# Patient Record
Sex: Female | Born: 1965 | Race: Black or African American | Hispanic: No | Marital: Single | Smoking: Never smoker
Health system: Southern US, Community
[De-identification: ages and names within clinical notes are randomized; demographics above are authoritative.]

## PROBLEM LIST (undated history)

## (undated) DIAGNOSIS — K219 Gastro-esophageal reflux disease without esophagitis: Secondary | ICD-10-CM

## (undated) DIAGNOSIS — F419 Anxiety disorder, unspecified: Secondary | ICD-10-CM

## (undated) DIAGNOSIS — M1990 Unspecified osteoarthritis, unspecified site: Secondary | ICD-10-CM

## (undated) DIAGNOSIS — K589 Irritable bowel syndrome without diarrhea: Secondary | ICD-10-CM

## (undated) DIAGNOSIS — J189 Pneumonia, unspecified organism: Secondary | ICD-10-CM

## (undated) HISTORY — PX: CHOLECYSTECTOMY: SHX55

## (undated) HISTORY — PX: GANGLION CYST EXCISION: SHX1691

## (undated) HISTORY — PX: BREAST BIOPSY: SHX20

---

## 2018-02-17 ENCOUNTER — Emergency Department (HOSPITAL_COMMUNITY): Payer: BC Managed Care – PPO

## 2018-02-17 ENCOUNTER — Inpatient Hospital Stay (HOSPITAL_COMMUNITY): Payer: BC Managed Care – PPO

## 2018-02-17 ENCOUNTER — Inpatient Hospital Stay (HOSPITAL_COMMUNITY)
Admission: EM | Admit: 2018-02-17 | Discharge: 2018-02-26 | DRG: 958 | Disposition: A | Discharge status: Rehabilitation Facility | Payer: BC Managed Care – PPO | Attending: General Surgery | Admitting: General Surgery

## 2018-02-17 ENCOUNTER — Encounter (HOSPITAL_COMMUNITY): Admission: EM | Disposition: A | Discharge status: Rehabilitation Facility | Payer: Self-pay | Source: Home / Self Care

## 2018-02-17 ENCOUNTER — Encounter (HOSPITAL_COMMUNITY): Payer: Self-pay | Admitting: *Deleted

## 2018-02-17 ENCOUNTER — Inpatient Hospital Stay (HOSPITAL_COMMUNITY): Payer: BC Managed Care – PPO | Admitting: Anesthesiology

## 2018-02-17 ENCOUNTER — Other Ambulatory Visit: Payer: Self-pay

## 2018-02-17 DIAGNOSIS — T380X5A Adverse effect of glucocorticoids and synthetic analogues, initial encounter: Secondary | ICD-10-CM | POA: Diagnosis not present

## 2018-02-17 DIAGNOSIS — T1490XA Injury, unspecified, initial encounter: Secondary | ICD-10-CM | POA: Diagnosis present

## 2018-02-17 DIAGNOSIS — S2231XA Fracture of one rib, right side, initial encounter for closed fracture: Secondary | ICD-10-CM | POA: Diagnosis present

## 2018-02-17 DIAGNOSIS — G5701 Lesion of sciatic nerve, right lower limb: Secondary | ICD-10-CM | POA: Diagnosis not present

## 2018-02-17 DIAGNOSIS — R740 Nonspecific elevation of levels of transaminase and lactic acid dehydrogenase [LDH]: Secondary | ICD-10-CM | POA: Diagnosis not present

## 2018-02-17 DIAGNOSIS — D62 Acute posthemorrhagic anemia: Secondary | ICD-10-CM | POA: Diagnosis present

## 2018-02-17 DIAGNOSIS — S2239XA Fracture of one rib, unspecified side, initial encounter for closed fracture: Secondary | ICD-10-CM

## 2018-02-17 DIAGNOSIS — J302 Other seasonal allergic rhinitis: Secondary | ICD-10-CM | POA: Diagnosis present

## 2018-02-17 DIAGNOSIS — Z6828 Body mass index (BMI) 28.0-28.9, adult: Secondary | ICD-10-CM

## 2018-02-17 DIAGNOSIS — S22008A Other fracture of unspecified thoracic vertebra, initial encounter for closed fracture: Secondary | ICD-10-CM

## 2018-02-17 DIAGNOSIS — E8809 Other disorders of plasma-protein metabolism, not elsewhere classified: Secondary | ICD-10-CM | POA: Diagnosis not present

## 2018-02-17 DIAGNOSIS — R509 Fever, unspecified: Secondary | ICD-10-CM | POA: Diagnosis not present

## 2018-02-17 DIAGNOSIS — S82891B Other fracture of right lower leg, initial encounter for open fracture type I or II: Secondary | ICD-10-CM

## 2018-02-17 DIAGNOSIS — S27329A Contusion of lung, unspecified, initial encounter: Secondary | ICD-10-CM

## 2018-02-17 DIAGNOSIS — E46 Unspecified protein-calorie malnutrition: Secondary | ICD-10-CM | POA: Diagnosis not present

## 2018-02-17 DIAGNOSIS — M7989 Other specified soft tissue disorders: Secondary | ICD-10-CM | POA: Diagnosis not present

## 2018-02-17 DIAGNOSIS — S301XXA Contusion of abdominal wall, initial encounter: Secondary | ICD-10-CM | POA: Diagnosis present

## 2018-02-17 DIAGNOSIS — S82002B Unspecified fracture of left patella, initial encounter for open fracture type I or II: Secondary | ICD-10-CM | POA: Diagnosis present

## 2018-02-17 DIAGNOSIS — S82401B Unspecified fracture of shaft of right fibula, initial encounter for open fracture type I or II: Secondary | ICD-10-CM | POA: Diagnosis present

## 2018-02-17 DIAGNOSIS — T07XXXA Unspecified multiple injuries, initial encounter: Secondary | ICD-10-CM

## 2018-02-17 DIAGNOSIS — S82201B Unspecified fracture of shaft of right tibia, initial encounter for open fracture type I or II: Secondary | ICD-10-CM | POA: Diagnosis present

## 2018-02-17 DIAGNOSIS — E876 Hypokalemia: Secondary | ICD-10-CM | POA: Diagnosis not present

## 2018-02-17 DIAGNOSIS — N179 Acute kidney failure, unspecified: Secondary | ICD-10-CM | POA: Diagnosis not present

## 2018-02-17 DIAGNOSIS — Z23 Encounter for immunization: Secondary | ICD-10-CM | POA: Diagnosis not present

## 2018-02-17 DIAGNOSIS — S22019A Unspecified fracture of first thoracic vertebra, initial encounter for closed fracture: Secondary | ICD-10-CM | POA: Diagnosis present

## 2018-02-17 DIAGNOSIS — S27321A Contusion of lung, unilateral, initial encounter: Secondary | ICD-10-CM | POA: Diagnosis present

## 2018-02-17 DIAGNOSIS — K5903 Drug induced constipation: Secondary | ICD-10-CM | POA: Diagnosis not present

## 2018-02-17 DIAGNOSIS — F411 Generalized anxiety disorder: Secondary | ICD-10-CM | POA: Diagnosis not present

## 2018-02-17 DIAGNOSIS — R739 Hyperglycemia, unspecified: Secondary | ICD-10-CM

## 2018-02-17 DIAGNOSIS — S92101B Unspecified fracture of right talus, initial encounter for open fracture: Secondary | ICD-10-CM | POA: Diagnosis present

## 2018-02-17 DIAGNOSIS — IMO0001 Reserved for inherently not codable concepts without codable children: Secondary | ICD-10-CM

## 2018-02-17 DIAGNOSIS — S61411A Laceration without foreign body of right hand, initial encounter: Secondary | ICD-10-CM | POA: Diagnosis present

## 2018-02-17 DIAGNOSIS — G8918 Other acute postprocedural pain: Secondary | ICD-10-CM

## 2018-02-17 DIAGNOSIS — S91001A Unspecified open wound, right ankle, initial encounter: Secondary | ICD-10-CM

## 2018-02-17 DIAGNOSIS — Y92410 Unspecified street and highway as the place of occurrence of the external cause: Secondary | ICD-10-CM | POA: Diagnosis not present

## 2018-02-17 DIAGNOSIS — F419 Anxiety disorder, unspecified: Secondary | ICD-10-CM | POA: Diagnosis present

## 2018-02-17 DIAGNOSIS — S9304XA Dislocation of right ankle joint, initial encounter: Secondary | ICD-10-CM

## 2018-02-17 DIAGNOSIS — T148XXA Other injury of unspecified body region, initial encounter: Secondary | ICD-10-CM | POA: Diagnosis not present

## 2018-02-17 DIAGNOSIS — M62838 Other muscle spasm: Secondary | ICD-10-CM | POA: Diagnosis not present

## 2018-02-17 HISTORY — PX: EXTERNAL FIXATION LEG: SHX1549

## 2018-02-17 HISTORY — DX: Unspecified osteoarthritis, unspecified site: M19.90

## 2018-02-17 HISTORY — DX: Anxiety disorder, unspecified: F41.9

## 2018-02-17 HISTORY — DX: Irritable bowel syndrome, unspecified: K58.9

## 2018-02-17 HISTORY — PX: I & D EXTREMITY: SHX5045

## 2018-02-17 LAB — CBC WITH DIFFERENTIAL/PLATELET
Basophils Absolute: 0 10*3/uL (ref 0.0–0.1)
Basophils Relative: 0 %
EOS ABS: 0.1 10*3/uL (ref 0.0–0.7)
EOS PCT: 1 %
HCT: 37.2 % (ref 36.0–46.0)
Hemoglobin: 12.9 g/dL (ref 12.0–15.0)
LYMPHS ABS: 2.9 10*3/uL (ref 0.7–4.0)
LYMPHS PCT: 20 %
MCH: 28 pg (ref 26.0–34.0)
MCHC: 34.7 g/dL (ref 30.0–36.0)
MCV: 80.9 fL (ref 78.0–100.0)
MONO ABS: 0.7 10*3/uL (ref 0.1–1.0)
MONOS PCT: 5 %
Neutro Abs: 11.1 10*3/uL — ABNORMAL HIGH (ref 1.7–7.7)
Neutrophils Relative %: 74 %
PLATELETS: 247 10*3/uL (ref 150–400)
RBC: 4.6 MIL/uL (ref 3.87–5.11)
RDW: 13.7 % (ref 11.5–15.5)
WBC: 14.8 10*3/uL — AB (ref 4.0–10.5)

## 2018-02-17 LAB — COMPREHENSIVE METABOLIC PANEL
ALT: 45 U/L (ref 14–54)
ANION GAP: 12 (ref 5–15)
AST: 54 U/L — ABNORMAL HIGH (ref 15–41)
Albumin: 3.4 g/dL — ABNORMAL LOW (ref 3.5–5.0)
Alkaline Phosphatase: 108 U/L (ref 38–126)
BUN: 11 mg/dL (ref 6–20)
CHLORIDE: 106 mmol/L (ref 101–111)
CO2: 19 mmol/L — AB (ref 22–32)
CREATININE: 1.09 mg/dL — AB (ref 0.44–1.00)
Calcium: 8.9 mg/dL (ref 8.9–10.3)
GFR, EST NON AFRICAN AMERICAN: 58 mL/min — AB (ref >60)
Glucose, Bld: 167 mg/dL — ABNORMAL HIGH (ref 65–99)
Potassium: 3.4 mmol/L — ABNORMAL LOW (ref 3.5–5.1)
SODIUM: 137 mmol/L (ref 135–145)
Total Bilirubin: 0.7 mg/dL (ref 0.3–1.2)
Total Protein: 6.6 g/dL (ref 6.5–8.1)

## 2018-02-17 LAB — TYPE AND SCREEN
ABO/RH(D): A POS
ANTIBODY SCREEN: NEGATIVE

## 2018-02-17 LAB — URINALYSIS, ROUTINE W REFLEX MICROSCOPIC
Bacteria, UA: NONE SEEN
Bilirubin Urine: NEGATIVE
GLUCOSE, UA: NEGATIVE mg/dL
Ketones, ur: NEGATIVE mg/dL
Leukocytes, UA: NEGATIVE
Nitrite: NEGATIVE
PROTEIN: NEGATIVE mg/dL
SQUAMOUS EPITHELIAL / LPF: NONE SEEN
Specific Gravity, Urine: 1.02 (ref 1.005–1.030)
pH: 6 (ref 5.0–8.0)

## 2018-02-17 LAB — ABO/RH: ABO/RH(D): A POS

## 2018-02-17 LAB — PROTIME-INR
INR: 1.17
Prothrombin Time: 14.8 seconds (ref 11.4–15.2)

## 2018-02-17 LAB — SURGICAL PCR SCREEN
MRSA, PCR: NEGATIVE
Staphylococcus aureus: NEGATIVE

## 2018-02-17 LAB — ETHANOL: Alcohol, Ethyl (B): <10 mg/dL (ref <10)

## 2018-02-17 SURGERY — EXTERNAL FIXATION, LOWER EXTREMITY
Anesthesia: General | Laterality: Right

## 2018-02-17 MED ORDER — FENTANYL CITRATE (PF) 100 MCG/2ML IJ SOLN
INTRAMUSCULAR | Status: AC | PRN
  Administered 2018-02-17: 50 ug via INTRAVENOUS

## 2018-02-17 MED ORDER — OXYCODONE HCL 5 MG PO TABS
5.0000 mg | ORAL_TABLET | Freq: Once | ORAL | Status: AC | PRN
  Administered 2018-02-17: 5 mg via ORAL

## 2018-02-17 MED ORDER — PANTOPRAZOLE SODIUM 40 MG IV SOLR
40.0000 mg | Freq: Every day | INTRAVENOUS | Status: DC
  Administered 2018-02-17: 40 mg via INTRAVENOUS
  Filled 2018-02-17: qty 40

## 2018-02-17 MED ORDER — PHENYLEPHRINE HCL 10 MG/ML IJ SOLN
INTRAMUSCULAR | Status: DC | PRN
  Administered 2018-02-17 (×3): 80 ug via INTRAVENOUS

## 2018-02-17 MED ORDER — ONDANSETRON HCL 4 MG/2ML IJ SOLN
INTRAMUSCULAR | Status: AC
  Filled 2018-02-17: qty 2

## 2018-02-17 MED ORDER — SODIUM CHLORIDE 0.9% FLUSH: 9.0000 mL | INTRAVENOUS | Status: DC | PRN

## 2018-02-17 MED ORDER — DIPHENHYDRAMINE HCL 50 MG/ML IJ SOLN: 12.5000 mg | Freq: Four times a day (QID) | INTRAMUSCULAR | Status: DC | PRN

## 2018-02-17 MED ORDER — SUGAMMADEX SODIUM 200 MG/2ML IV SOLN
INTRAVENOUS | Status: AC
  Filled 2018-02-17: qty 2

## 2018-02-17 MED ORDER — ONDANSETRON HCL 4 MG/2ML IJ SOLN: 4.0000 mg | Freq: Four times a day (QID) | INTRAMUSCULAR | Status: DC | PRN

## 2018-02-17 MED ORDER — FENTANYL CITRATE (PF) 100 MCG/2ML IJ SOLN
INTRAMUSCULAR | Status: AC
  Filled 2018-02-17: qty 2

## 2018-02-17 MED ORDER — ONDANSETRON 4 MG PO TBDP: 4.0000 mg | ORAL_TABLET | Freq: Four times a day (QID) | ORAL | Status: DC | PRN

## 2018-02-17 MED ORDER — HYDROMORPHONE HCL 1 MG/ML IJ SOLN
0.5000 mg | INTRAMUSCULAR | Status: DC | PRN
  Administered 2018-02-17: 0.5 mg via INTRAVENOUS
  Filled 2018-02-17: qty 1

## 2018-02-17 MED ORDER — ONDANSETRON HCL 4 MG/2ML IJ SOLN: 4.0000 mg | Freq: Once | INTRAMUSCULAR | Status: DC | PRN

## 2018-02-17 MED ORDER — CHLORHEXIDINE GLUCONATE 4 % EX LIQD
60.0000 mL | Freq: Once | CUTANEOUS | Status: DC
  Filled 2018-02-17: qty 60

## 2018-02-17 MED ORDER — LACTATED RINGERS IV SOLN
INTRAVENOUS | Status: DC | PRN
  Administered 2018-02-17: 19:00:00 via INTRAVENOUS

## 2018-02-17 MED ORDER — METHOCARBAMOL 1000 MG/10ML IJ SOLN
1000.0000 mg | Freq: Once | INTRAVENOUS | Status: DC
  Filled 2018-02-17: qty 10

## 2018-02-17 MED ORDER — KCL IN DEXTROSE-NACL 20-5-0.45 MEQ/L-%-% IV SOLN
INTRAVENOUS | Status: DC
  Administered 2018-02-17: 100 mL via INTRAVENOUS
  Administered 2018-02-18: 01:00:00 via INTRAVENOUS
  Filled 2018-02-17: qty 1000

## 2018-02-17 MED ORDER — TRAMADOL HCL 50 MG PO TABS: 50.0000 mg | ORAL_TABLET | Freq: Four times a day (QID) | ORAL | Status: DC | PRN

## 2018-02-17 MED ORDER — TETANUS-DIPHTH-ACELL PERTUSSIS 5-2.5-18.5 LF-MCG/0.5 IM SUSP
0.5000 mL | Freq: Once | INTRAMUSCULAR | Status: AC
Start: 2018-02-17 — End: 2018-02-17
  Administered 2018-02-17: 0.5 mL via INTRAMUSCULAR
  Filled 2018-02-17: qty 0.5

## 2018-02-17 MED ORDER — ROCURONIUM BROMIDE 100 MG/10ML IV SOLN
INTRAVENOUS | Status: DC | PRN
  Administered 2018-02-17 (×2): 20 mg via INTRAVENOUS
  Administered 2018-02-17: 10 mg via INTRAVENOUS
  Administered 2018-02-17: 30 mg via INTRAVENOUS

## 2018-02-17 MED ORDER — PANTOPRAZOLE SODIUM 40 MG PO TBEC
40.0000 mg | DELAYED_RELEASE_TABLET | Freq: Every day | ORAL | Status: DC
  Administered 2018-02-18 – 2018-02-26 (×8): 40 mg via ORAL
  Filled 2018-02-17 (×8): qty 1

## 2018-02-17 MED ORDER — LACTATED RINGERS IV SOLN
INTRAVENOUS | Status: DC
  Administered 2018-02-17: 17:00:00 via INTRAVENOUS

## 2018-02-17 MED ORDER — ONDANSETRON HCL 4 MG/2ML IJ SOLN
INTRAMUSCULAR | Status: DC | PRN
  Administered 2018-02-17: 4 mg via INTRAVENOUS

## 2018-02-17 MED ORDER — PROPOFOL 10 MG/ML IV BOLUS
INTRAVENOUS | Status: DC | PRN
  Administered 2018-02-17: 80 mg via INTRAVENOUS

## 2018-02-17 MED ORDER — FENTANYL CITRATE (PF) 100 MCG/2ML IJ SOLN
INTRAMUSCULAR | Status: AC | PRN
  Administered 2018-02-17: 100 ug via INTRAVENOUS

## 2018-02-17 MED ORDER — OXYCODONE HCL 5 MG PO TABS
ORAL_TABLET | ORAL | Status: AC
  Filled 2018-02-17: qty 1

## 2018-02-17 MED ORDER — PROPOFOL 10 MG/ML IV BOLUS
INTRAVENOUS | Status: AC
  Filled 2018-02-17: qty 20

## 2018-02-17 MED ORDER — MIDAZOLAM HCL 5 MG/5ML IJ SOLN
INTRAMUSCULAR | Status: DC | PRN
  Administered 2018-02-17: 2 mg via INTRAVENOUS

## 2018-02-17 MED ORDER — BISACODYL 10 MG RE SUPP: 10.0000 mg | Freq: Every day | RECTAL | Status: DC | PRN

## 2018-02-17 MED ORDER — 0.9 % SODIUM CHLORIDE (POUR BTL) OPTIME
TOPICAL | Status: DC | PRN
  Administered 2018-02-17: 1000 mL

## 2018-02-17 MED ORDER — SODIUM CHLORIDE 0.9 % IV SOLN
INTRAVENOUS | Status: AC | PRN
  Administered 2018-02-17: 1000 mL via INTRAVENOUS

## 2018-02-17 MED ORDER — CEFAZOLIN SODIUM-DEXTROSE 2-4 GM/100ML-% IV SOLN
2.0000 g | Freq: Once | INTRAVENOUS | Status: AC
  Administered 2018-02-17: 2 g via INTRAVENOUS
  Filled 2018-02-17: qty 100

## 2018-02-17 MED ORDER — METOPROLOL TARTRATE 5 MG/5ML IV SOLN: 5.0000 mg | Freq: Four times a day (QID) | INTRAVENOUS | Status: DC | PRN

## 2018-02-17 MED ORDER — FENTANYL CITRATE (PF) 100 MCG/2ML IJ SOLN
25.0000 ug | INTRAMUSCULAR | Status: AC | PRN
  Administered 2018-02-17 – 2018-02-18 (×6): 25 ug via INTRAVENOUS

## 2018-02-17 MED ORDER — LACTATED RINGERS IV SOLN
INTRAVENOUS | Status: DC | PRN
  Administered 2018-02-17 (×2): via INTRAVENOUS

## 2018-02-17 MED ORDER — FENTANYL CITRATE (PF) 250 MCG/5ML IJ SOLN
INTRAMUSCULAR | Status: AC
  Filled 2018-02-17: qty 5

## 2018-02-17 MED ORDER — LIDOCAINE HCL (CARDIAC) 20 MG/ML IV SOLN
INTRAVENOUS | Status: DC | PRN
  Administered 2018-02-17: 35 mg via INTRAVENOUS

## 2018-02-17 MED ORDER — SUGAMMADEX SODIUM 200 MG/2ML IV SOLN
INTRAVENOUS | Status: DC | PRN
  Administered 2018-02-17: 200 mg via INTRAVENOUS

## 2018-02-17 MED ORDER — DIPHENHYDRAMINE HCL 12.5 MG/5ML PO ELIX: 12.5000 mg | ORAL_SOLUTION | Freq: Four times a day (QID) | ORAL | Status: DC | PRN

## 2018-02-17 MED ORDER — SODIUM CHLORIDE 0.9 % IR SOLN
Status: DC | PRN
  Administered 2018-02-17: 6000 mL

## 2018-02-17 MED ORDER — HYDROMORPHONE 1 MG/ML IV SOLN
INTRAVENOUS | Status: DC
  Administered 2018-02-18: 1.2 mg via INTRAVENOUS
  Administered 2018-02-18: 3.1 mg via INTRAVENOUS
  Administered 2018-02-18: 1.2 mg via INTRAVENOUS
  Administered 2018-02-18: 01:00:00 via INTRAVENOUS
  Administered 2018-02-18: 2.1 mg via INTRAVENOUS
  Administered 2018-02-18 (×2): 3 mg via INTRAVENOUS
  Administered 2018-02-19 (×2): 0.3 mg via INTRAVENOUS
  Administered 2018-02-19: 1.2 mg via INTRAVENOUS
  Administered 2018-02-19: 1.5 mg via INTRAVENOUS
  Administered 2018-02-19: 0.9 mg via INTRAVENOUS
  Administered 2018-02-20: 1.2 mg via INTRAVENOUS
  Administered 2018-02-20: 13:00:00 via INTRAVENOUS
  Administered 2018-02-20: 2.7 mg via INTRAVENOUS
  Administered 2018-02-21: 0 mL via INTRAVENOUS
  Administered 2018-02-21: 1.5 mg via INTRAVENOUS
  Administered 2018-02-21: 0.3 mg via INTRAVENOUS
  Filled 2018-02-17: qty 25

## 2018-02-17 MED ORDER — NALOXONE HCL 0.4 MG/ML IJ SOLN: 0.4000 mg | INTRAMUSCULAR | Status: DC | PRN

## 2018-02-17 MED ORDER — FENTANYL CITRATE (PF) 100 MCG/2ML IJ SOLN: 100.0000 ug | Freq: Once | INTRAMUSCULAR | Status: DC

## 2018-02-17 MED ORDER — SUCCINYLCHOLINE CHLORIDE 20 MG/ML IJ SOLN
INTRAMUSCULAR | Status: DC | PRN
  Administered 2018-02-17 (×2): 100 mg via INTRAVENOUS

## 2018-02-17 MED ORDER — IOPAMIDOL (ISOVUE-300) INJECTION 61%
INTRAVENOUS | Status: AC
  Administered 2018-02-17: 100 mL
  Filled 2018-02-17: qty 100

## 2018-02-17 MED ORDER — OXYCODONE HCL 5 MG/5ML PO SOLN: 5.0000 mg | Freq: Once | ORAL | Status: AC | PRN

## 2018-02-17 MED ORDER — POVIDONE-IODINE 10 % EX SWAB: 2.0000 "application " | Freq: Once | CUTANEOUS | Status: DC

## 2018-02-17 MED ORDER — OXYCODONE HCL 5 MG PO TABS: 5.0000 mg | ORAL_TABLET | ORAL | Status: DC | PRN

## 2018-02-17 MED ORDER — CEFAZOLIN SODIUM-DEXTROSE 2-4 GM/100ML-% IV SOLN
2.0000 g | INTRAVENOUS | Status: AC
  Administered 2018-02-17: 2 g via INTRAVENOUS
  Filled 2018-02-17: qty 100

## 2018-02-17 MED ORDER — FENTANYL CITRATE (PF) 100 MCG/2ML IJ SOLN
50.0000 ug | INTRAMUSCULAR | Status: DC | PRN
  Administered 2018-02-17: 50 ug via INTRAVENOUS
  Filled 2018-02-17 (×2): qty 2

## 2018-02-17 MED ORDER — HYDROMORPHONE HCL 1 MG/ML IJ SOLN
1.0000 mg | INTRAMUSCULAR | Status: DC | PRN
  Administered 2018-02-17 (×3): 2 mg via INTRAVENOUS
  Filled 2018-02-17 (×3): qty 2

## 2018-02-17 SURGICAL SUPPLY — 63 items
BANDAGE ACE 4X5 VEL STRL LF (GAUZE/BANDAGES/DRESSINGS) ×8 IMPLANT
BANDAGE ACE 6X5 VEL STRL LF (GAUZE/BANDAGES/DRESSINGS) ×8 IMPLANT
BANDAGE ESMARK 6X9 LF (GAUZE/BANDAGES/DRESSINGS) ×2 IMPLANT
BAR GLASS FIBER EXFX 11X150 (EXFIX) ×8 IMPLANT
BAR GLASS FIBER EXFX 11X350 (EXFIX) ×8 IMPLANT
BNDG COHESIVE 6X5 TAN STRL LF (GAUZE/BANDAGES/DRESSINGS) IMPLANT
BNDG CONFORM 2 STRL LF (GAUZE/BANDAGES/DRESSINGS) ×4 IMPLANT
BNDG ELASTIC 2X5.8 VLCR STR LF (GAUZE/BANDAGES/DRESSINGS) ×4 IMPLANT
BNDG ESMARK 6X9 LF (GAUZE/BANDAGES/DRESSINGS) ×4
BNDG GAUZE ELAST 4 BULKY (GAUZE/BANDAGES/DRESSINGS) ×12 IMPLANT
BRUSH SCRUB SURG 4.25 DISP (MISCELLANEOUS) ×8 IMPLANT
CLAMP BAR TO BAR (EXFIX) ×8 IMPLANT
CLAMP BAR TO PIN (EXFIX) ×16 IMPLANT
CLAMP MULTI-PIN 2-BAR 75MM (EXFIX) ×4 IMPLANT
CLOSURE WOUND 1/2 X4 (GAUZE/BANDAGES/DRESSINGS)
COVER SURGICAL LIGHT HANDLE (MISCELLANEOUS) ×12 IMPLANT
CUFF TOURNIQUET SINGLE 18IN (TOURNIQUET CUFF) IMPLANT
DRAIN PENROSE 1/4X12 LTX STRL (WOUND CARE) ×4 IMPLANT
DRAPE C-ARM 42X72 X-RAY (DRAPES) IMPLANT
DRAPE C-ARMOR (DRAPES) ×4 IMPLANT
DRAPE U-SHAPE 47X51 STRL (DRAPES) ×4 IMPLANT
DRSG ADAPTIC 3X8 NADH LF (GAUZE/BANDAGES/DRESSINGS) ×4 IMPLANT
DRSG MEPITEL 4X7.2 (GAUZE/BANDAGES/DRESSINGS) ×8 IMPLANT
DRSG VAC ATS SM SENSATRAC (GAUZE/BANDAGES/DRESSINGS) ×4 IMPLANT
ELECT REM PT RETURN 9FT ADLT (ELECTROSURGICAL) ×4
ELECTRODE REM PT RTRN 9FT ADLT (ELECTROSURGICAL) ×2 IMPLANT
EVACUATOR 1/8 PVC DRAIN (DRAIN) ×4 IMPLANT
GAUZE SPONGE 4X4 12PLY STRL (GAUZE/BANDAGES/DRESSINGS) ×12 IMPLANT
GLOVE BIO SURGEON STRL SZ7.5 (GLOVE) ×4 IMPLANT
GLOVE BIO SURGEON STRL SZ8 (GLOVE) ×4 IMPLANT
GLOVE BIOGEL PI IND STRL 7.5 (GLOVE) ×2 IMPLANT
GLOVE BIOGEL PI IND STRL 8 (GLOVE) ×2 IMPLANT
GLOVE BIOGEL PI INDICATOR 7.5 (GLOVE) ×2
GLOVE BIOGEL PI INDICATOR 8 (GLOVE) ×2
GOWN STRL REUS W/ TWL LRG LVL3 (GOWN DISPOSABLE) ×4 IMPLANT
GOWN STRL REUS W/ TWL XL LVL3 (GOWN DISPOSABLE) ×2 IMPLANT
GOWN STRL REUS W/TWL LRG LVL3 (GOWN DISPOSABLE) ×4
GOWN STRL REUS W/TWL XL LVL3 (GOWN DISPOSABLE) ×2
HALF PIN 3MM (EXFIX) ×4 IMPLANT
K-WIRE DBL END TROCAR 6X.062 (WIRE) ×4
KIT BASIN OR (CUSTOM PROCEDURE TRAY) ×4 IMPLANT
KIT ROOM TURNOVER OR (KITS) ×4 IMPLANT
KWIRE DBL END TROCAR 6X.062 (WIRE) ×2 IMPLANT
MANIFOLD NEPTUNE II (INSTRUMENTS) ×4 IMPLANT
NEEDLE 22X1 1/2 (OR ONLY) (NEEDLE) IMPLANT
NS IRRIG 1000ML POUR BTL (IV SOLUTION) ×4 IMPLANT
PACK ORTHO EXTREMITY (CUSTOM PROCEDURE TRAY) ×4 IMPLANT
PAD ARMBOARD 7.5X6 YLW CONV (MISCELLANEOUS) ×8 IMPLANT
PADDING CAST COTTON 6X4 STRL (CAST SUPPLIES) ×8 IMPLANT
PIN 3MM (EXFIX) ×4 IMPLANT
PIN HALF YELLOW 5X160X35 (EXFIX) ×8 IMPLANT
PIN TRANSFIXING 5.0 (EXFIX) ×4 IMPLANT
SPONGE LAP 18X18 X RAY DECT (DISPOSABLE) ×4 IMPLANT
STAPLER VISISTAT 35W (STAPLE) IMPLANT
STOCKINETTE IMPERVIOUS LG (DRAPES) IMPLANT
STRIP CLOSURE SKIN 1/2X4 (GAUZE/BANDAGES/DRESSINGS) IMPLANT
SUT ETHILON 2 0 PSLX (SUTURE) ×4 IMPLANT
SUT PDS AB 1 CT  36 (SUTURE) ×2
SUT PDS AB 1 CT 36 (SUTURE) ×2 IMPLANT
SUT PDS AB 2-0 CT1 27 (SUTURE) ×4 IMPLANT
TOWEL OR 17X24 6PK STRL BLUE (TOWEL DISPOSABLE) ×8 IMPLANT
TOWEL OR 17X26 10 PK STRL BLUE (TOWEL DISPOSABLE) ×8 IMPLANT
UNDERPAD 30X30 (UNDERPADS AND DIAPERS) ×4 IMPLANT

## 2018-02-17 NOTE — Anesthesia Procedure Notes (Signed)
Procedure Name: Intubation Date/Time: 02/17/2018 7:19 PM Performed by: Purvis Kilts, CRNA Pre-anesthesia Checklist: Patient identified, Emergency Drugs available, Suction available, Patient being monitored and Timeout performed Patient Re-evaluated:Patient Re-evaluated prior to induction Oxygen Delivery Method: Circle system utilized Preoxygenation: Pre-oxygenation with 100% oxygen Induction Type: IV induction Ventilation: Mask ventilation without difficulty Laryngoscope Size: Mac and 3 Grade View: Grade I Tube type: Oral Tube size: 7.0 mm Number of attempts: 1 Airway Equipment and Method: Stylet Placement Confirmation: ETT inserted through vocal cords under direct vision,  positive ETCO2 and breath sounds checked- equal and bilateral Secured at: 21 cm Tube secured with: Tape Dental Injury: Teeth and Oropharynx as per pre-operative assessment

## 2018-02-17 NOTE — Transfer of Care (Signed)
Immediate Anesthesia Transfer of Care Note  [facility]  Procedure(s) Performed: EXTERNAL FIXATION RIGHT ANKLE/I&D RIGHT ANKLE (Right ) IRRIGATION AND DEBRIDEMENT PATELLA (Left ) MINOR IRRIGATION AND DEBRIDEMENT RIGHT HAND (Right )  Patient Location: PACU  Anesthesia Type:General  Level of Consciousness: awake  Airway & Oxygen Therapy: Patient Spontanous Breathing  Post-op Assessment: Report given to RN and Post -op Vital signs reviewed and stable  Post vital signs: Reviewed and stable  Last Vitals:  Vitals Value Taken Time  BP    Temp    Pulse    Resp    SpO2      Last Pain:  Vitals:   02/17/18 1636  TempSrc: Oral  PainSc:          Complications: No apparent anesthesia complications

## 2018-02-17 NOTE — ED Notes (Signed)
Spoke with ortho PA RE patients uncontrolled pain.  PA to put in orders for PCA pump and robaxin IV.

## 2018-02-17 NOTE — ED Notes (Signed)
Placed on 2L [place] d/t large dose of narcotics being given.
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
## 2018-02-17 NOTE — Progress Notes (Signed)
Received patient from ED and upon transferring patint from strecther to bed, OR calling for patient to be pick up for surgery.VS taken nad mrsa screening done. Consent was taken as well prior to transfer. Called report to short stay.

## 2018-02-17 NOTE — Brief Op Note (Signed)
02/17/2018  11:03 PM  PATIENT:  Lauren [name]  52 y.o. female  PRE-OPERATIVE DIAGNOSIS:   1. GRADE 3A OPEN RIGHT ANKLE DISLOCATION 2. GRADE 3A OPEN RIGHT FIBULA FRACTURE 3. CONTAMINATED LEFT KNEE TRAUMATIC ARTHROTOMY 4. GRADE 3A OPEN LEFT PATELLA FRACTURE 5. PENETRATING WOUND RIGHT KNEE 6. RIGHT HAND SMALL AND RING FINGER LACERATIONS  POST-OPERATIVE DIAGNOSIS:   1. GRADE 3A OPEN RIGHT ANKLE DISLOCATION 2. GRADE 3A OPEN RIGHT FIBULA FRACTURE 3. CONTAMINATED LEFT KNEE TRAUMATIC ARTHROTOMY 4. GRADE 3A OPEN LEFT PATELLA FRACTURE 5. PENETRATING WOUND RIGHT KNEE 6. RIGHT HAND SMALL AND RING FINGER LACERATIONS  PROCEDURE:  Procedure(s): 1. OPEN TREATMENT OF RIGHT TALUS FRACTURE DISLOCATION 2. EXPLORATION AND DEBRIDEMENT OF PENETRATING EXTREMITY WOUND RIGHT KNEE  3. OPEN TREATMENT OF RIGHT FIBULA FRACTURE 4. OPEN TREATMENT OF LEFT PATELLA FRACTURE 5. APPLICATION OF EXTERNAL FIXATION RIGHT ANKLE 6. ARTHROTOMY WITH IRRIGATION AND DEBRIDEMENT LEFT OPEN KNEE JOINT 7. IRRIGATION AND DEBRIDEMENT RIGHT FIBULA FRACTURE 8. IRRIGATION AND DEBRIDEMENT OPEN LEFT PATELLA FRACTURE 9. IRRIGATION AND SIMPLE CLOSURE RIGHT HAND WOUNDS SMALL FINGER AND RING FINGER 10. RETENTION SUTURE CLOSURE LEFT KNEE WOUND 15CM 11. APPLICATION OF SMALL WOUND VAC RIGHT ANKLE  SURGEON:  Surgeon(s) and Role:    Carola Frost* Lauren [name], Lauren NeedleMichael, MD - Primary  PHYSICIAN ASSISTANT: PA Student  ANESTHESIA:   general  EBL:  180 mL   BLOOD ADMINISTERED:none  DRAINS: Penrose drain in the both knees; hemovac in left knee; wound vac right ankle   LOCAL MEDICATIONS USED:  NONE  SPECIMEN:  No Specimen  DISPOSITION OF SPECIMEN:  N/A  COUNTS:  YES  TOURNIQUET:  * No tourniquets in log *  DICTATION: .Other Dictation: Dictation Number [id number]  PLAN OF CARE: Admit to inpatient   PATIENT DISPOSITION:  PACU - hemodynamically stable.   Delay start of Pharmacological VTE agent (>24hrs) due to surgical blood loss or risk of bleeding:  no
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
## 2018-02-17 NOTE — ED Notes (Signed)
Transported to ct 

## 2018-02-17 NOTE — Progress Notes (Signed)
   02/17/18 1000  Clinical Encounter Type  Visited With Patient and family together  Visit Type ED  Referral From Chaplain  Consult/Referral To Chaplain  Spiritual Encounters  Spiritual Needs Emotional;Prayer   Took over this patient from on call Chaplain.  Patient has lots of support from her family and co-workers.  Visited and supported patient and the family.  Will follow and support as needed. Chaplain Agustin CreeNewton Azavier Creson

## 2018-02-17 NOTE — ED Notes (Addendum)
Trauma start  

## 2018-02-18 ENCOUNTER — Encounter (HOSPITAL_COMMUNITY): Payer: Self-pay | Admitting: Orthopedic Surgery

## 2018-02-18 ENCOUNTER — Inpatient Hospital Stay (HOSPITAL_COMMUNITY): Payer: BC Managed Care – PPO

## 2018-02-18 LAB — COMPREHENSIVE METABOLIC PANEL
ALBUMIN: 3 g/dL — AB (ref 3.5–5.0)
ALT: 42 U/L (ref 14–54)
AST: 48 U/L — ABNORMAL HIGH (ref 15–41)
Alkaline Phosphatase: 82 U/L (ref 38–126)
Anion gap: 8 (ref 5–15)
BUN: 10 mg/dL (ref 6–20)
CO2: 25 mmol/L (ref 22–32)
Calcium: 8.1 mg/dL — ABNORMAL LOW (ref 8.9–10.3)
Chloride: 106 mmol/L (ref 101–111)
Creatinine, Ser: 1.2 mg/dL — ABNORMAL HIGH (ref 0.44–1.00)
GFR calc Af Amer: >60 mL/min (ref >60)
GFR calc non Af Amer: 52 mL/min — ABNORMAL LOW (ref >60)
GLUCOSE: 192 mg/dL — AB (ref 65–99)
POTASSIUM: 4.1 mmol/L (ref 3.5–5.1)
SODIUM: 139 mmol/L (ref 135–145)
Total Bilirubin: 0.9 mg/dL (ref 0.3–1.2)
Total Protein: 6 g/dL — ABNORMAL LOW (ref 6.5–8.1)

## 2018-02-18 LAB — CBC
HCT: 30.3 % — ABNORMAL LOW (ref 36.0–46.0)
HEMOGLOBIN: 10.1 g/dL — AB (ref 12.0–15.0)
MCH: 26.9 pg (ref 26.0–34.0)
MCHC: 33.3 g/dL (ref 30.0–36.0)
MCV: 80.6 fL (ref 78.0–100.0)
Platelets: 228 10*3/uL (ref 150–400)
RBC: 3.76 MIL/uL — ABNORMAL LOW (ref 3.87–5.11)
RDW: 13.8 % (ref 11.5–15.5)
WBC: 9.1 10*3/uL (ref 4.0–10.5)

## 2018-02-18 LAB — HIV ANTIBODY (ROUTINE TESTING W REFLEX): HIV Screen 4th Generation wRfx: NONREACTIVE

## 2018-02-18 MED ORDER — CEFAZOLIN SODIUM-DEXTROSE 1-4 GM/50ML-% IV SOLN
1.0000 g | Freq: Three times a day (TID) | INTRAVENOUS | Status: DC
  Administered 2018-02-18 – 2018-02-24 (×19): 1 g via INTRAVENOUS
  Filled 2018-02-18 (×20): qty 50

## 2018-02-18 MED ORDER — ALBUTEROL SULFATE (2.5 MG/3ML) 0.083% IN NEBU: 3.0000 mL | INHALATION_SOLUTION | RESPIRATORY_TRACT | Status: DC | PRN

## 2018-02-18 MED ORDER — ACETAMINOPHEN 325 MG PO TABS
650.0000 mg | ORAL_TABLET | Freq: Four times a day (QID) | ORAL | Status: DC | PRN
  Administered 2018-02-19: 650 mg via ORAL
  Filled 2018-02-18: qty 2

## 2018-02-18 MED ORDER — HYDROMORPHONE 1 MG/ML IV SOLN
INTRAVENOUS | Status: AC
  Filled 2018-02-18: qty 25

## 2018-02-18 MED ORDER — BUSPIRONE HCL 10 MG PO TABS
10.0000 mg | ORAL_TABLET | Freq: Three times a day (TID) | ORAL | Status: DC
  Administered 2018-02-18 – 2018-02-26 (×25): 10 mg via ORAL
  Filled 2018-02-18 (×29): qty 1

## 2018-02-18 MED ORDER — HYDROMORPHONE HCL 1 MG/ML IJ SOLN
INTRAMUSCULAR | Status: AC
  Filled 2018-02-18: qty 1

## 2018-02-18 MED ORDER — FLUTICASONE PROPIONATE 50 MCG/ACT NA SUSP
1.0000 | Freq: Every day | NASAL | Status: DC
  Administered 2018-02-18: 2 via NASAL
  Administered 2018-02-19 – 2018-02-25 (×5): 1 via NASAL
  Filled 2018-02-18: qty 16

## 2018-02-18 MED ORDER — KCL IN DEXTROSE-NACL 20-5-0.45 MEQ/L-%-% IV SOLN
INTRAVENOUS | Status: AC
  Filled 2018-02-18: qty 1000

## 2018-02-18 MED ORDER — SODIUM CHLORIDE 0.9 % IV SOLN: Freq: Three times a day (TID) | INTRAVENOUS | Status: DC

## 2018-02-18 MED ORDER — SODIUM CHLORIDE 0.9 % IV SOLN: INTRAVENOUS | Status: DC

## 2018-02-18 MED ORDER — MONTELUKAST SODIUM 10 MG PO TABS
10.0000 mg | ORAL_TABLET | Freq: Every day | ORAL | Status: DC
  Administered 2018-02-19 – 2018-02-25 (×7): 10 mg via ORAL
  Filled 2018-02-18 (×7): qty 1

## 2018-02-18 MED ORDER — SODIUM CHLORIDE 0.9 % IV SOLN
INTRAVENOUS | Status: DC
  Administered 2018-02-18 – 2018-02-21 (×3): via INTRAVENOUS
  Filled 2018-02-18 (×2): qty 1000

## 2018-02-18 MED ORDER — ENOXAPARIN SODIUM 40 MG/0.4ML ~~LOC~~ SOLN
40.0000 mg | SUBCUTANEOUS | Status: DC
  Administered 2018-02-18 – 2018-02-26 (×8): 40 mg via SUBCUTANEOUS
  Filled 2018-02-18 (×8): qty 0.4

## 2018-02-18 MED ORDER — HYDROMORPHONE HCL 1 MG/ML IJ SOLN
0.2500 mg | INTRAMUSCULAR | Status: DC | PRN
  Administered 2018-02-18: 0.5 mg via INTRAVENOUS

## 2018-02-18 MED ORDER — GENTAMICIN IN SALINE 1.6-0.9 MG/ML-% IV SOLN
80.0000 mg | Freq: Three times a day (TID) | INTRAVENOUS | Status: DC
  Administered 2018-02-18 – 2018-02-20 (×8): 80 mg via INTRAVENOUS
  Filled 2018-02-18 (×9): qty 50
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
## 2018-02-18 NOTE — Anesthesia Postprocedure Evaluation (Signed)
Anesthesia Post Note  Patient: Lora PaulaMaxine Truxillo  Procedure(s) Performed: EXTERNAL FIXATION RIGHT ANKLE/I&D RIGHT ANKLE (Right ) IRRIGATION AND DEBRIDEMENT PATELLA (Left ) MINOR IRRIGATION AND DEBRIDEMENT RIGHT HAND (Right )     Patient location during evaluation: PACU Anesthesia Type: General Level of consciousness: sedated, oriented and patient cooperative Pain management: pain level controlled (paim improving) Vital Signs Assessment: post-procedure vital signs reviewed and stable Respiratory status: spontaneous breathing, nonlabored ventilation, respiratory function stable and patient connected to nasal cannula oxygen Cardiovascular status: blood pressure returned to baseline and stable Postop Assessment: no apparent nausea or vomiting Anesthetic complications: no    Last Vitals:  Vitals:   02/18/18 0045 02/18/18 0058  BP:    Pulse: 84   Resp: 17 13  Temp:    SpO2: 100% 99%    Last Pain:  Vitals:   02/18/18 0058  TempSrc:   PainSc: 10-Worst pain ever                 Ausencio Vaden,E. Quanesha Klimaszewski

## 2018-02-18 NOTE — Plan of Care (Signed)
  Problem: Health Behavior/Discharge Planning: Goal: Ability to manage health-related needs will improve Outcome: Progressing   Problem: Clinical Measurements: Goal: Ability to maintain clinical measurements within normal limits will improve Outcome: Progressing Goal: Diagnostic test results will improve Outcome: Progressing Goal: Respiratory complications will improve Outcome: Progressing Goal: Cardiovascular complication will be avoided Outcome: Progressing   Problem: Safety: Goal: Ability to remain free from injury will improve Outcome: Progressing
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
## 2018-02-18 NOTE — Progress Notes (Signed)
[facility] (OTS)  1 Day Post-Op Procedure(s) (LRB): EXTERNAL FIXATION RIGHT ANKLE/I&D RIGHT ANKLE (Right) IRRIGATION AND DEBRIDEMENT PATELLA (Left) MINOR IRRIGATION AND DEBRIDEMENT RIGHT HAND (Right)  Subjective: Patient reports pain as moderate. Decreased sensation in dorsum of right foot.  Objective: Current Vitals Blood pressure 109/78, pulse 85, temperature 99.8 F (37.7 C), temperature source Oral, resp. rate 14, height 5\' 8"  (1.727 m), weight 85.7 kg (189 lb), SpO2 100 %. Vital signs in last 24 hours: Temp:  [97.5 F (36.4 C)-99.8 F (37.7 C)] 99.8 F (37.7 C) (03/26 1018) Pulse Rate:  [67-102] 85 (03/26 1018) Resp:  [9-21] 14 (03/26 1158) BP: (88-147)/(53-102) 109/78 (03/26 1018) SpO2:  [95 %-100 %] 100 % (03/26 1158)  Intake/Output from previous day: 03/25 0701 - 03/26 0700 In: 4115 [P.O.:720; I.V.:3145; IV Piggyback:100] Out: 3575 [Urine:3425; Drains:50; Blood:100]  LABS Recent Labs    02/17/18 0815 02/18/18 0500  HGB 12.9 10.1*   Recent Labs    02/17/18 0815 02/18/18 0500  WBC 14.8* 9.1  RBC 4.60 3.76*  HCT 37.2 30.3*  PLT 247 228   Recent Labs    02/17/18 0815 02/18/18 0500  NA 137 139  K 3.4* 4.1  CL 106 106  CO2 19* 25  BUN 11 10  CREATININE 1.09* 1.20*  GLUCOSE 167* 192*  CALCIUM 8.9 8.1*   Recent Labs    02/17/18 0815  INR 1.17     Physical Exam RUE  Dressing clean and intact  Sens  Ax/R/M/U intact  Mot   Ax/ R/ PIN/ M/ AIN/ U intact  Brisk CR R&LLE  Dressing intact, clean, dry  Edema/ swelling controlled  Sens: DPN, SPN, TN intact on Left; absent DPN and reduced SPN on the right  Motor: EHL, FHL, and lessor toe ext and flex all intact grossly  Brisk cap refill, warm to touch  Assessment/Plan: 1 Day Post-Op Procedure(s) (LRB): EXTERNAL FIXATION RIGHT ANKLE/I&D RIGHT ANKLE (Right) IRRIGATION AND DEBRIDEMENT PATELLA (Left) MINOR IRRIGATION AND DEBRIDEMENT RIGHT HAND (Right) 1. PT/OT  2. DVT proph  Lovenox 3. OR on Thursday am for repeat I&D  Myrene GalasMichael Pihu Basil, MD [facility] [phone] (p)

## 2018-02-19 LAB — CBC
HCT: 26.5 % — ABNORMAL LOW (ref 36.0–46.0)
Hemoglobin: 8.7 g/dL — ABNORMAL LOW (ref 12.0–15.0)
MCH: 26.8 pg (ref 26.0–34.0)
MCHC: 32.8 g/dL (ref 30.0–36.0)
MCV: 81.5 fL (ref 78.0–100.0)
PLATELETS: 178 10*3/uL (ref 150–400)
RBC: 3.25 MIL/uL — ABNORMAL LOW (ref 3.87–5.11)
RDW: 13.6 % (ref 11.5–15.5)
WBC: 9.1 10*3/uL (ref 4.0–10.5)

## 2018-02-19 LAB — BASIC METABOLIC PANEL
Anion gap: 9 (ref 5–15)
BUN: 7 mg/dL (ref 6–20)
CALCIUM: 8.3 mg/dL — AB (ref 8.9–10.3)
CHLORIDE: 105 mmol/L (ref 101–111)
CO2: 23 mmol/L (ref 22–32)
CREATININE: 0.94 mg/dL (ref 0.44–1.00)
GFR calc Af Amer: >60 mL/min (ref >60)
Glucose, Bld: 104 mg/dL — ABNORMAL HIGH (ref 65–99)
Potassium: 4.3 mmol/L (ref 3.5–5.1)
SODIUM: 137 mmol/L (ref 135–145)

## 2018-02-19 MED ORDER — METHOCARBAMOL 500 MG PO TABS
500.0000 mg | ORAL_TABLET | Freq: Three times a day (TID) | ORAL | Status: DC
  Administered 2018-02-19 – 2018-02-26 (×22): 500 mg via ORAL
  Filled 2018-02-19 (×22): qty 1

## 2018-02-19 NOTE — Anesthesia Preprocedure Evaluation (Addendum)
Anesthesia Evaluation  Patient identified by MRN, date of birth, ID band Patient awake    Reviewed: Allergy & Precautions, H&P , NPO status , Patient's Chart, lab work & pertinent test results  Airway Mallampati: II  TM Distance: >3 FB Neck ROM: Full    Dental no notable dental hx. (+) Teeth Intact, Dental Advisory Given   Pulmonary neg pulmonary ROS,    Pulmonary exam normal breath sounds clear to auscultation       Cardiovascular Exercise Tolerance: Good negative cardio ROS   Rhythm:Regular Rate:Normal     Neuro/Psych Anxiety negative neurological ROS  negative psych ROS   GI/Hepatic negative GI ROS, Neg liver ROS,   Endo/Other  negative endocrine ROS  Renal/GU negative Renal ROS  negative genitourinary   Musculoskeletal  (+) Arthritis , Osteoarthritis,    Abdominal   Peds  Hematology negative hematology ROS (+)   Anesthesia Other Findings   Reproductive/Obstetrics negative OB ROS                            Anesthesia Physical Anesthesia Plan  ASA: II  Anesthesia Plan: General   Post-op Pain Management:    Induction: Intravenous  PONV Risk Score and Plan: 4 or greater and Ondansetron, Dexamethasone and Midazolam  Airway Management Planned: Oral ETT  Additional Equipment:   Intra-op Plan:   Post-operative Plan: Extubation in OR  Informed Consent: I have reviewed the patients History and Physical, chart, labs and discussed the procedure including the risks, benefits and alternatives for the proposed anesthesia with the patient or authorized representative who has indicated his/her understanding and acceptance.   Dental advisory given  Plan Discussed with: CRNA  Anesthesia Plan Comments:         Anesthesia Quick Evaluation

## 2018-02-19 NOTE — Progress Notes (Signed)
[facility]uma [facility] (OTS)  2 Days Post-Op Procedure(s) (LRB): EXTERNAL FIXATION RIGHT ANKLE/I&D RIGHT ANKLE (Right) IRRIGATION AND DEBRIDEMENT PATELLA (Left) MINOR IRRIGATION AND DEBRIDEMENT RIGHT HAND (Right)  Subjective: Patient reports pain as improving; no change in loss of sensation on the right foot..    Objective: Current Vitals Blood pressure 118/65, pulse 83, temperature 99.9 F (37.7 C), temperature source Oral, resp. rate 16, height 5\' 8"  (1.727 m), weight 85.7 kg (189 lb), SpO2 100 %. Vital signs in last 24 hours: Temp:  [98.7 F (37.1 C)-100.9 F (38.3 C)] 99.9 F (37.7 C) (03/27 1751) Pulse Rate:  [74-92] 83 (03/27 1751) Resp:  [14-17] 16 (03/27 1951) BP: (108-128)/(8-65) 118/65 (03/27 1751) SpO2:  [100 %] 100 % (03/27 1951) FiO2 (%):  [50 %] 50 % (03/26 2350)  Intake/Output from previous day: 03/26 0701 - 03/27 0700 In: 1716.7 [P.O.:960; I.V.:656.7; IV Piggyback:100] Out: 4075 [Urine:4000; Drains:75]  LABS Recent Labs    02/17/18 0815 02/18/18 0500 02/19/18 0854  HGB 12.9 10.1* 8.7*   Recent Labs    02/18/18 0500 02/19/18 0854  WBC 9.1 9.1  RBC 3.76* 3.25*  HCT 30.3* 26.5*  PLT 228 178   Recent Labs    02/18/18 0500 02/19/18 0854  NA 139 137  K 4.1 4.3  CL 106 105  CO2 25 23  BUN 10 7  CREATININE 1.20* 0.94  GLUCOSE 192* 104*  CALCIUM 8.1* 8.3*   Recent Labs    02/17/18 0815  INR 1.17     Physical Exam R&LLE  Dressing intact, clean, dry  Edema/ swelling controlled  Sens: DPN, SPN, TN intact  Motor: EHL, FHL, and lessor toe ext and flex all intact grossly  Brisk cap refill, warm to touch  Assessment/Plan: 2 Days Post-Op Procedure(s) (LRB): EXTERNAL FIXATION RIGHT ANKLE/I&D RIGHT ANKLE (Right) IRRIGATION AND DEBRIDEMENT PATELLA (Left) MINOR IRRIGATION AND DEBRIDEMENT RIGHT HAND (Right) 1. PT/OT WBAT on LLE for xfers 2. DVT proph Lovenox 3. OR tomorrow for repeat I&D; cont abx  Lauren GalasMichael Izreal Kock, MD [facility] [phone] (p)
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
## 2018-02-20 ENCOUNTER — Inpatient Hospital Stay (HOSPITAL_COMMUNITY): Payer: BC Managed Care – PPO | Admitting: Anesthesiology

## 2018-02-20 ENCOUNTER — Inpatient Hospital Stay (HOSPITAL_COMMUNITY): Payer: BC Managed Care – PPO

## 2018-02-20 ENCOUNTER — Encounter (HOSPITAL_COMMUNITY): Admission: EM | Disposition: A | Discharge status: Rehabilitation Facility | Payer: Self-pay | Source: Home / Self Care

## 2018-02-20 HISTORY — PX: I & D EXTREMITY: SHX5045

## 2018-02-20 LAB — BASIC METABOLIC PANEL
Anion gap: 9 (ref 5–15)
BUN: 6 mg/dL (ref 6–20)
CALCIUM: 8.1 mg/dL — AB (ref 8.9–10.3)
CHLORIDE: 103 mmol/L (ref 101–111)
CO2: 25 mmol/L (ref 22–32)
CREATININE: 0.87 mg/dL (ref 0.44–1.00)
GFR calc Af Amer: >60 mL/min (ref >60)
GFR calc non Af Amer: >60 mL/min (ref >60)
Glucose, Bld: 124 mg/dL — ABNORMAL HIGH (ref 65–99)
Potassium: 3.9 mmol/L (ref 3.5–5.1)
Sodium: 137 mmol/L (ref 135–145)

## 2018-02-20 LAB — CBC
HCT: 26.1 % — ABNORMAL LOW (ref 36.0–46.0)
Hemoglobin: 8.6 g/dL — ABNORMAL LOW (ref 12.0–15.0)
MCH: 26.6 pg (ref 26.0–34.0)
MCHC: 33 g/dL (ref 30.0–36.0)
MCV: 80.8 fL (ref 78.0–100.0)
Platelets: 179 10*3/uL (ref 150–400)
RBC: 3.23 MIL/uL — AB (ref 3.87–5.11)
RDW: 13.6 % (ref 11.5–15.5)
WBC: 9.2 10*3/uL (ref 4.0–10.5)

## 2018-02-20 SURGERY — IRRIGATION AND DEBRIDEMENT EXTREMITY
Anesthesia: General | Site: Leg Lower | Laterality: Bilateral

## 2018-02-20 MED ORDER — CEFAZOLIN SODIUM 1 G IJ SOLR
INTRAMUSCULAR | Status: AC
  Filled 2018-02-20: qty 10

## 2018-02-20 MED ORDER — LIDOCAINE HCL (CARDIAC) 20 MG/ML IV SOLN
INTRAVENOUS | Status: AC
Start: 2018-02-20 — End: ?
  Filled 2018-02-20: qty 5

## 2018-02-20 MED ORDER — ROCURONIUM BROMIDE 10 MG/ML (PF) SYRINGE
PREFILLED_SYRINGE | INTRAVENOUS | Status: AC
  Filled 2018-02-20: qty 5

## 2018-02-20 MED ORDER — ONDANSETRON HCL 4 MG/2ML IJ SOLN
INTRAMUSCULAR | Status: AC
  Filled 2018-02-20: qty 2

## 2018-02-20 MED ORDER — SODIUM CHLORIDE 0.9 % IR SOLN
Status: DC | PRN
  Administered 2018-02-20 (×4): 3000 mL

## 2018-02-20 MED ORDER — FENTANYL CITRATE (PF) 250 MCG/5ML IJ SOLN
INTRAMUSCULAR | Status: AC
  Filled 2018-02-20: qty 5

## 2018-02-20 MED ORDER — LORATADINE 10 MG PO TABS
10.0000 mg | ORAL_TABLET | Freq: Every day | ORAL | Status: DC
  Administered 2018-02-20 – 2018-02-26 (×7): 10 mg via ORAL
  Filled 2018-02-20 (×7): qty 1

## 2018-02-20 MED ORDER — MIDAZOLAM HCL 5 MG/5ML IJ SOLN
INTRAMUSCULAR | Status: DC | PRN
  Administered 2018-02-20: 2 mg via INTRAVENOUS

## 2018-02-20 MED ORDER — SUGAMMADEX SODIUM 200 MG/2ML IV SOLN
INTRAVENOUS | Status: AC
  Filled 2018-02-20: qty 2

## 2018-02-20 MED ORDER — ROCURONIUM BROMIDE 10 MG/ML (PF) SYRINGE
PREFILLED_SYRINGE | INTRAVENOUS | Status: DC | PRN
  Administered 2018-02-20: 10 mg via INTRAVENOUS
  Administered 2018-02-20: 40 mg via INTRAVENOUS
  Administered 2018-02-20: 10 mg via INTRAVENOUS
  Administered 2018-02-20: 20 mg via INTRAVENOUS
  Administered 2018-02-20: 10 mg via INTRAVENOUS

## 2018-02-20 MED ORDER — KETOROLAC TROMETHAMINE 30 MG/ML IJ SOLN
INTRAMUSCULAR | Status: DC | PRN
  Administered 2018-02-20: 30 mg via INTRAVENOUS

## 2018-02-20 MED ORDER — ONDANSETRON HCL 4 MG/2ML IJ SOLN
INTRAMUSCULAR | Status: DC | PRN
  Administered 2018-02-20: 4 mg via INTRAVENOUS

## 2018-02-20 MED ORDER — HYDROMORPHONE HCL 1 MG/ML IJ SOLN: 0.2500 mg | INTRAMUSCULAR | Status: DC | PRN

## 2018-02-20 MED ORDER — DEXAMETHASONE SODIUM PHOSPHATE 10 MG/ML IJ SOLN
INTRAMUSCULAR | Status: DC | PRN
  Administered 2018-02-20: 10 mg via INTRAVENOUS

## 2018-02-20 MED ORDER — VANCOMYCIN HCL 1000 MG IV SOLR
INTRAVENOUS | Status: DC | PRN
  Administered 2018-02-20: 1000 mg via TOPICAL

## 2018-02-20 MED ORDER — MIDAZOLAM HCL 2 MG/2ML IJ SOLN
INTRAMUSCULAR | Status: AC
  Filled 2018-02-20: qty 2

## 2018-02-20 MED ORDER — LIDOCAINE 2% (20 MG/ML) 5 ML SYRINGE
INTRAMUSCULAR | Status: DC | PRN
  Administered 2018-02-20: 60 mg via INTRAVENOUS

## 2018-02-20 MED ORDER — PROPOFOL 10 MG/ML IV BOLUS
INTRAVENOUS | Status: AC
  Filled 2018-02-20: qty 20

## 2018-02-20 MED ORDER — KETOROLAC TROMETHAMINE 30 MG/ML IJ SOLN
INTRAMUSCULAR | Status: AC
  Filled 2018-02-20: qty 1

## 2018-02-20 MED ORDER — VANCOMYCIN HCL 1000 MG IV SOLR
INTRAVENOUS | Status: AC
  Filled 2018-02-20: qty 2000

## 2018-02-20 MED ORDER — LACTATED RINGERS IV SOLN
INTRAVENOUS | Status: DC | PRN
  Administered 2018-02-20 (×2): via INTRAVENOUS

## 2018-02-20 MED ORDER — DEXAMETHASONE SODIUM PHOSPHATE 10 MG/ML IJ SOLN
INTRAMUSCULAR | Status: AC
  Filled 2018-02-20: qty 1

## 2018-02-20 MED ORDER — FENTANYL CITRATE (PF) 250 MCG/5ML IJ SOLN
INTRAMUSCULAR | Status: DC | PRN
  Administered 2018-02-20: 25 ug via INTRAVENOUS
  Administered 2018-02-20: 50 ug via INTRAVENOUS
  Administered 2018-02-20: 75 ug via INTRAVENOUS

## 2018-02-20 MED ORDER — 0.9 % SODIUM CHLORIDE (POUR BTL) OPTIME
TOPICAL | Status: DC | PRN
  Administered 2018-02-20: 1000 mL

## 2018-02-20 MED ORDER — PROPOFOL 10 MG/ML IV BOLUS
INTRAVENOUS | Status: DC | PRN
  Administered 2018-02-20: 150 mg via INTRAVENOUS

## 2018-02-20 MED ORDER — SUGAMMADEX SODIUM 200 MG/2ML IV SOLN
INTRAVENOUS | Status: DC | PRN
  Administered 2018-02-20: 171.4 mg via INTRAVENOUS

## 2018-02-20 SURGICAL SUPPLY — 64 items
BANDAGE ACE 4X5 VEL STRL LF (GAUZE/BANDAGES/DRESSINGS) ×6 IMPLANT
BANDAGE ACE 6X5 VEL STRL LF (GAUZE/BANDAGES/DRESSINGS) ×6 IMPLANT
BNDG COHESIVE 4X5 TAN STRL (GAUZE/BANDAGES/DRESSINGS) IMPLANT
BNDG ELASTIC 2 VLCR STRL LF (GAUZE/BANDAGES/DRESSINGS) ×3 IMPLANT
BNDG GAUZE ELAST 4 BULKY (GAUZE/BANDAGES/DRESSINGS) ×12 IMPLANT
BNDG GAUZE STRTCH 6 (GAUZE/BANDAGES/DRESSINGS) IMPLANT
BRUSH SCRUB SURG 4.25 DISP (MISCELLANEOUS) ×6 IMPLANT
COVER SURGICAL LIGHT HANDLE (MISCELLANEOUS) ×3 IMPLANT
DRAIN PENROSE 1/4X12 LTX STRL (WOUND CARE) ×3 IMPLANT
DRAPE EXTREMITY BILATERAL (DRAPES) ×3 IMPLANT
DRAPE U-SHAPE 47X51 STRL (DRAPES) IMPLANT
DRSG ADAPTIC 3X8 NADH LF (GAUZE/BANDAGES/DRESSINGS) IMPLANT
DRSG MEPITEL 4X7.2 (GAUZE/BANDAGES/DRESSINGS) ×6 IMPLANT
DRSG VAC ATS SM SENSATRAC (GAUZE/BANDAGES/DRESSINGS) ×3 IMPLANT
ELECT REM PT RETURN 9FT ADLT (ELECTROSURGICAL) ×3
ELECTRODE REM PT RTRN 9FT ADLT (ELECTROSURGICAL) ×1 IMPLANT
EVACUATOR 3/16  PVC DRAIN (DRAIN) ×2
EVACUATOR 3/16 PVC DRAIN (DRAIN) ×1 IMPLANT
FLUID NSS /IRRIG 3000 ML XXX (IV SOLUTION) ×12 IMPLANT
GAUZE SPONGE 4X4 12PLY STRL (GAUZE/BANDAGES/DRESSINGS) ×6 IMPLANT
GLOVE BIO SURGEON STRL SZ7.5 (GLOVE) IMPLANT
GLOVE BIO SURGEON STRL SZ8 (GLOVE) ×12 IMPLANT
GLOVE BIOGEL PI IND STRL 6.5 (GLOVE) ×1 IMPLANT
GLOVE BIOGEL PI IND STRL 7.5 (GLOVE) IMPLANT
GLOVE BIOGEL PI IND STRL 8 (GLOVE) ×2 IMPLANT
GLOVE BIOGEL PI INDICATOR 6.5 (GLOVE) ×2
GLOVE BIOGEL PI INDICATOR 7.5 (GLOVE)
GLOVE BIOGEL PI INDICATOR 8 (GLOVE) ×4
GLOVE SURG SS PI 8.0 STRL IVOR (GLOVE) ×6 IMPLANT
GOWN STRL REUS W/ TWL LRG LVL3 (GOWN DISPOSABLE) ×1 IMPLANT
GOWN STRL REUS W/ TWL XL LVL3 (GOWN DISPOSABLE) ×2 IMPLANT
GOWN STRL REUS W/TWL LRG LVL3 (GOWN DISPOSABLE) ×2
GOWN STRL REUS W/TWL XL LVL3 (GOWN DISPOSABLE) ×4
HANDPIECE INTERPULSE COAX TIP (DISPOSABLE) ×2
KIT BASIN OR (CUSTOM PROCEDURE TRAY) ×3 IMPLANT
KIT TURNOVER KIT B (KITS) ×3 IMPLANT
MANIFOLD NEPTUNE II (INSTRUMENTS) ×3 IMPLANT
NS IRRIG 1000ML POUR BTL (IV SOLUTION) ×3 IMPLANT
PACK ORTHO EXTREMITY (CUSTOM PROCEDURE TRAY) ×3 IMPLANT
PAD ABD 8X10 STRL (GAUZE/BANDAGES/DRESSINGS) ×9 IMPLANT
PAD ARMBOARD 7.5X6 YLW CONV (MISCELLANEOUS) ×3 IMPLANT
PAD CAST 4YDX4 CTTN HI CHSV (CAST SUPPLIES) ×1 IMPLANT
PADDING CAST COTTON 4X4 STRL (CAST SUPPLIES) ×2
PADDING CAST COTTON 6X4 STRL (CAST SUPPLIES) ×3 IMPLANT
RETRIEVER SUT HEWSON (MISCELLANEOUS) ×3 IMPLANT
SCRUB FOAM CHG 2% SURGICAL (MISCELLANEOUS) ×3 IMPLANT
SET HNDPC FAN SPRY TIP SCT (DISPOSABLE) ×1 IMPLANT
SPONGE LAP 18X18 X RAY DECT (DISPOSABLE) ×3 IMPLANT
STOCKINETTE IMPERVIOUS 9X36 MD (GAUZE/BANDAGES/DRESSINGS) IMPLANT
STOCKINETTE IMPERVIOUS LG (DRAPES) ×3 IMPLANT
SUT ETHILON 2 0 PSLX (SUTURE) ×6 IMPLANT
SUT PDS AB 0 CT 36 (SUTURE) ×3 IMPLANT
SUT PDS AB 1 CT  36 (SUTURE) ×4
SUT PDS AB 1 CT 36 (SUTURE) ×2 IMPLANT
SUT PDS AB 2-0 CT1 27 (SUTURE) ×6 IMPLANT
SUT PROLENE 0 CT 1 30 (SUTURE) ×3 IMPLANT
SWAB CULTURE ESWAB REG 1ML (MISCELLANEOUS) IMPLANT
TOWEL OR 17X24 6PK STRL BLUE (TOWEL DISPOSABLE) ×3 IMPLANT
TOWEL OR 17X26 10 PK STRL BLUE (TOWEL DISPOSABLE) ×3 IMPLANT
TUBE CONNECTING 12'X1/4 (SUCTIONS) ×1
TUBE CONNECTING 12X1/4 (SUCTIONS) ×2 IMPLANT
UNDERPAD 30X30 (UNDERPADS AND DIAPERS) ×3 IMPLANT
WATER STERILE IRR 1000ML POUR (IV SOLUTION) IMPLANT
YANKAUER SUCT BULB TIP NO VENT (SUCTIONS) IMPLANT

## 2018-02-20 NOTE — Progress Notes (Signed)
1300 received pt from PACU. A & O x 4, bilateral dressing lower extremities, dry/intact. Pt in pain, restarted PCA.

## 2018-02-20 NOTE — Anesthesia Postprocedure Evaluation (Signed)
Anesthesia Post Note  Patient: Lauren [name]  Procedure(s) Performed: IRRIGATION AND DEBRIDEMENT TALUS, ADJUSTMENT OF EXTERNAL FIXATOR,  IRRIGATION AND DEBRIDEMENT OF LEFT KNEE, REPAIR OF LEFT PATELLA (Bilateral Leg Lower)     Patient location during evaluation: PACU Anesthesia Type: General Level of consciousness: awake and alert Pain management: pain level controlled Vital Signs Assessment: post-procedure vital signs reviewed and stable Respiratory status: spontaneous breathing, nonlabored ventilation and respiratory function stable Cardiovascular status: blood pressure returned to baseline and stable Postop Assessment: no apparent nausea or vomiting Anesthetic complications: no    Last Vitals:  Vitals:   02/20/18 1245 02/20/18 1305  BP: 140/79 133/73  Pulse: 79 80  Resp: (!) 21 18  Temp: 37.1 C 37 C  SpO2: 100% 100%    Last Pain:  Vitals:   02/20/18 1305  TempSrc: Oral  PainSc:                  Aliscia Clayton,W. EDMOND
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
## 2018-02-20 NOTE — Progress Notes (Signed)
Pt to OR. With O2 at 1L. Per OR nurse to disconnect Pt from PCA.
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
## 2018-02-20 NOTE — Transfer of Care (Signed)
Immediate Anesthesia Transfer of Care Note  Patient: Lauren [name]  Procedure(s) Performed: IRRIGATION AND DEBRIDEMENT TALUS, ADJUSTMENT OF EXTERNAL FIXATOR,  IRRIGATION AND DEBRIDEMENT OF LEFT KNEE, REPAIR OF LEFT PATELLA (Bilateral Leg Lower)  Patient Location: PACU  Anesthesia Type:General  Level of Consciousness: drowsy and patient cooperative  Airway & Oxygen Therapy: Patient Spontanous Breathing and Patient connected to face mask oxygen  Post-op Assessment: Report given to RN and Post -op Vital signs reviewed and stable  Post vital signs: Reviewed and stable  Last Vitals:  Vitals Value Taken Time  BP 176/82 02/20/2018 11:13 AM  Temp    Pulse 41 02/20/2018 11:16 AM  Resp 19 02/20/2018 11:16 AM  SpO2 100 % 02/20/2018 11:16 AM  Vitals shown include unvalidated device data.  Last Pain:  Vitals:   02/20/18 0518  TempSrc: Oral  PainSc:          Complications: No apparent anesthesia complications

## 2018-02-20 NOTE — Anesthesia Procedure Notes (Addendum)
Procedure Name: Intubation Date/Time: 02/20/2018 8:10 AM Performed by: Freddie Breech, CRNA Pre-anesthesia Checklist: Patient identified, Emergency Drugs available, Suction available and Patient being monitored Patient Re-evaluated:Patient Re-evaluated prior to induction Oxygen Delivery Method: Circle System Utilized Preoxygenation: Pre-oxygenation with 100% oxygen Induction Type: IV induction Ventilation: Mask ventilation without difficulty Laryngoscope Size: Mac and 3 Grade View: Grade II Tube type: Oral Tube size: 7.0 mm Number of attempts: 2 (CRNA DVL X1 and unable to achieve adequate view.  Repositioned head and CRNA DVL X 2 with grade 2 view. ) Airway Equipment and Method: Stylet and Oral airway Placement Confirmation: ETT inserted through vocal cords under direct vision,  positive ETCO2 and breath sounds checked- equal and bilateral Secured at: 21 cm Tube secured with: Tape Dental Injury: Teeth and Oropharynx as per pre-operative assessment

## 2018-02-21 ENCOUNTER — Encounter (HOSPITAL_COMMUNITY): Payer: Self-pay | Admitting: Orthopedic Surgery

## 2018-02-21 LAB — CBC
HEMATOCRIT: 25.2 % — AB (ref 36.0–46.0)
Hemoglobin: 8.6 g/dL — ABNORMAL LOW (ref 12.0–15.0)
MCH: 27.3 pg (ref 26.0–34.0)
MCHC: 34.1 g/dL (ref 30.0–36.0)
MCV: 80 fL (ref 78.0–100.0)
Platelets: 240 10*3/uL (ref 150–400)
RBC: 3.15 MIL/uL — AB (ref 3.87–5.11)
RDW: 13.7 % (ref 11.5–15.5)
WBC: 11 10*3/uL — AB (ref 4.0–10.5)

## 2018-02-21 LAB — BASIC METABOLIC PANEL
ANION GAP: 9 (ref 5–15)
BUN: 10 mg/dL (ref 6–20)
CALCIUM: 8.1 mg/dL — AB (ref 8.9–10.3)
CO2: 26 mmol/L (ref 22–32)
Chloride: 102 mmol/L (ref 101–111)
Creatinine, Ser: 0.86 mg/dL (ref 0.44–1.00)
Glucose, Bld: 148 mg/dL — ABNORMAL HIGH (ref 65–99)
Potassium: 3.5 mmol/L (ref 3.5–5.1)
Sodium: 137 mmol/L (ref 135–145)

## 2018-02-21 MED ORDER — DIPHENHYDRAMINE HCL 50 MG/ML IJ SOLN
12.5000 mg | Freq: Four times a day (QID) | INTRAMUSCULAR | Status: DC | PRN
  Administered 2018-02-21 – 2018-02-24 (×7): 25 mg via INTRAVENOUS
  Filled 2018-02-21 (×7): qty 1

## 2018-02-21 MED ORDER — OXYCODONE HCL 5 MG PO TABS
5.0000 mg | ORAL_TABLET | ORAL | Status: DC | PRN
  Administered 2018-02-21 – 2018-02-24 (×8): 10 mg via ORAL
  Administered 2018-02-24: 5 mg via ORAL
  Administered 2018-02-24: 10 mg via ORAL
  Administered 2018-02-24: 5 mg via ORAL
  Administered 2018-02-25: 10 mg via ORAL
  Filled 2018-02-21 (×14): qty 2

## 2018-02-21 MED ORDER — HYDROMORPHONE HCL 1 MG/ML IJ SOLN
1.0000 mg | INTRAMUSCULAR | Status: DC | PRN
  Administered 2018-02-21 – 2018-02-22 (×2): 1 mg via INTRAVENOUS
  Administered 2018-02-22: 2 mg via INTRAVENOUS
  Administered 2018-02-22 (×2): 1 mg via INTRAVENOUS
  Administered 2018-02-22: 2 mg via INTRAVENOUS
  Administered 2018-02-23 – 2018-02-24 (×5): 1 mg via INTRAVENOUS
  Filled 2018-02-21 (×3): qty 1
  Filled 2018-02-21: qty 2
  Filled 2018-02-21 (×4): qty 1
  Filled 2018-02-21 (×2): qty 2
  Filled 2018-02-21 (×2): qty 1
  Filled 2018-02-21: qty 2

## 2018-02-21 MED ORDER — ACETAMINOPHEN 325 MG PO TABS
650.0000 mg | ORAL_TABLET | Freq: Four times a day (QID) | ORAL | Status: DC
  Administered 2018-02-21 – 2018-02-26 (×22): 650 mg via ORAL
  Filled 2018-02-21 (×23): qty 2

## 2018-02-21 MED ORDER — POLYETHYLENE GLYCOL 3350 17 G PO PACK
17.0000 g | PACK | Freq: Every day | ORAL | Status: DC | PRN
  Administered 2018-02-22: 17 g via ORAL
  Filled 2018-02-21: qty 1

## 2018-02-21 MED ORDER — DOCUSATE SODIUM 100 MG PO CAPS
100.0000 mg | ORAL_CAPSULE | Freq: Two times a day (BID) | ORAL | Status: DC
  Administered 2018-02-21 – 2018-02-26 (×11): 100 mg via ORAL
  Filled 2018-02-21 (×11): qty 1
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
## 2018-02-21 NOTE — Progress Notes (Signed)
Orthopaedic Trauma Service (OTS)  1 Day Post-Op Procedure(s) (LRB): IRRIGATION AND DEBRIDEMENT TALUS, ADJUSTMENT OF EXTERNAL FIXATOR,  IRRIGATION AND DEBRIDEMENT OF LEFT KNEE, REPAIR OF LEFT PATELLA (Bilateral)  Subjective: Patient reports pain as moderately severe at the moment on more on the left knee than the right ankle. Some difficulty mobilizing.  Objective: Current Vitals Blood pressure 123/68, pulse 73, temperature 98.5 F (36.9 C), temperature source Oral, resp. rate 14, height 5\' 8"  (1.727 m), weight 85.7 kg (189 lb), SpO2 100 %. Vital signs in last 24 hours: Temp:  [97.7 F (36.5 C)-98.8 F (37.1 C)] 98.5 F (36.9 C) (03/29 1059) Pulse Rate:  [72-90] 73 (03/29 1059) Resp:  [10-27] 14 (03/29 1114) BP: (105-151)/(59-87) 123/68 (03/29 1059) SpO2:  [97 %-100 %] 100 % (03/29 1114) FiO2 (%):  [50 %] 50 % (03/29 1114)  Intake/Output from previous day: 03/28 0701 - 03/29 0700 In: 3439.2 [P.O.:590; I.V.:2799.2; IV Piggyback:50] Out: 4060 [Urine:3950; Drains:10; Blood:100]  LABS Recent Labs    02/19/18 0854 02/20/18 0412 02/21/18 0849  HGB 8.7* 8.6* 8.6*   Recent Labs    02/20/18 0412 02/21/18 0849  WBC 9.2 11.0*  RBC 3.23* 3.15*  HCT 26.1* 25.2*  PLT 179 240   Recent Labs    02/20/18 0412 02/21/18 0849  NA 137 137  K 3.9 3.5  CL 103 102  CO2 25 26  BUN 6 10  CREATININE 0.87 0.86  GLUCOSE 124* 148*  CALCIUM 8.1* 8.1*   No results for input(s): LABPT, INR in the last 72 hours.   Physical Exam RUE  Dressing changed R&LLE   Drains intact  Dressing intact, clean, dry  Edema/ swelling controlled  Sens: DPN, SPN, TN intact  Motor: EHL, FHL, and lessor toe ext and flex all intact grossly  Brisk cap refill, warm to touch  Assessment/Plan: 1 Day Post-Op Procedure(s) (LRB): IRRIGATION AND DEBRIDEMENT TALUS, ADJUSTMENT OF EXTERNAL FIXATOR,  IRRIGATION AND DEBRIDEMENT OF LEFT KNEE, REPAIR OF LEFT PATELLA (Bilateral) 1. PT/OT WBAT on LLE; no active  extension against resistance to extent feasible; no motion restrictions! 2. DVT proph Lovenox 3. No further surgery planned this admission; late removal of ex fix at 6-8 wks 4. Drains to be removed from left knee tomorrow, hemovacs and two penrose 5. Rehab as inpatient likely which would be great in my opinion  Myrene GalasMichael Adelaido Nicklaus, MD [facility] [phone] (p)
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
## 2018-02-22 LAB — COMPREHENSIVE METABOLIC PANEL
ALBUMIN: 2.4 g/dL — AB (ref 3.5–5.0)
ALT: 53 U/L (ref 14–54)
AST: 89 U/L — AB (ref 15–41)
Alkaline Phosphatase: 77 U/L (ref 38–126)
Anion gap: 9 (ref 5–15)
BUN: 10 mg/dL (ref 6–20)
CHLORIDE: 103 mmol/L (ref 101–111)
CO2: 27 mmol/L (ref 22–32)
Calcium: 8.3 mg/dL — ABNORMAL LOW (ref 8.9–10.3)
Creatinine, Ser: 0.95 mg/dL (ref 0.44–1.00)
GFR calc Af Amer: >60 mL/min (ref >60)
GFR calc non Af Amer: >60 mL/min (ref >60)
GLUCOSE: 120 mg/dL — AB (ref 65–99)
POTASSIUM: 3.3 mmol/L — AB (ref 3.5–5.1)
Sodium: 139 mmol/L (ref 135–145)
Total Bilirubin: 0.6 mg/dL (ref 0.3–1.2)
Total Protein: 5.9 g/dL — ABNORMAL LOW (ref 6.5–8.1)

## 2018-02-22 LAB — CBC
HCT: 24.5 % — ABNORMAL LOW (ref 36.0–46.0)
Hemoglobin: 8.3 g/dL — ABNORMAL LOW (ref 12.0–15.0)
MCH: 27.7 pg (ref 26.0–34.0)
MCHC: 33.9 g/dL (ref 30.0–36.0)
MCV: 81.7 fL (ref 78.0–100.0)
PLATELETS: 250 10*3/uL (ref 150–400)
RBC: 3 MIL/uL — ABNORMAL LOW (ref 3.87–5.11)
RDW: 13.9 % (ref 11.5–15.5)
WBC: 6.8 10*3/uL (ref 4.0–10.5)
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
## 2018-02-22 NOTE — Progress Notes (Signed)
Subjective: 2 Days Post-Op Procedure(s) (LRB): IRRIGATION AND DEBRIDEMENT TALUS, ADJUSTMENT OF EXTERNAL FIXATOR,  IRRIGATION AND DEBRIDEMENT OF LEFT KNEE, REPAIR OF LEFT PATELLA (Bilateral) Patient reports pain as moderate.    Objective: Vital signs in last 24 hours: Temp:  [98.5 F (36.9 C)-98.7 F (37.1 C)] 98.7 F (37.1 C) (03/30 0351) Pulse Rate:  [72-82] 73 (03/30 0351) Resp:  [14-18] 18 (03/30 0351) BP: (109-123)/(63-70) 109/63 (03/30 0351) SpO2:  [99 %-100 %] 99 % (03/30 0351) FiO2 (%):  [50 %] 50 % (03/29 1114)  Intake/Output from previous day: 03/29 0701 - 03/30 0700 In: 520 [P.O.:470; IV Piggyback:50] Out: 1580 [Urine:1550; Drains:30] Intake/Output this shift: No intake/output data recorded.  Recent Labs    02/20/18 0412 02/21/18 0849 02/22/18 0732  HGB 8.6* 8.6* 8.3*   Recent Labs    02/21/18 0849 02/22/18 0732  WBC 11.0* 6.8  RBC 3.15* 3.00*  HCT 25.2* 24.5*  PLT 240 250   Recent Labs    02/21/18 0849 02/22/18 0732  NA 137 139  K 3.5 3.3*  CL 102 103  CO2 26 27  BUN 10 10  CREATININE 0.86 0.95  GLUCOSE 148* 120*  CALCIUM 8.1* 8.3*   No results for input(s): LABPT, INR in the last 72 hours.  Neurovascular intact Sensation intact distally Intact pulses distally Dorsiflexion/Plantar flexion intact Incision: dressing C/D/I, scant drainage and 2 hemovacs and 2 penrose drains pulled from left knee and dsg changed today No cellulitis present Compartment soft  Assessment/Plan: 2 Days Post-Op Procedure(s) (LRB): IRRIGATION AND DEBRIDEMENT TALUS, ADJUSTMENT OF EXTERNAL FIXATOR,  IRRIGATION AND DEBRIDEMENT OF LEFT KNEE, REPAIR OF LEFT PATELLA (Bilateral)  1. PT/OT WBAT on LLE; no active extension against resistance to extent feasible; no motion restrictions! 2. DVT proph Lovenox 3. No further surgery planned this admission; late removal of ex fix at 6-8 wks 4. Drains to be removed from left knee tomorrow, hemovacs and two penrose 5. Rehab as  inpatient likely which would be great in my opinion  Trapeze for bed ordered   [facility] Physicians[facility] Stephanie Mcglone 02/22/2018, 10:41 AM

## 2018-02-22 NOTE — Progress Notes (Signed)
Orthopedic Tech Progress Note Patient Details:  Lora PaulaMaxine Whisner 03/12/1966 [id number]  Ortho Devices Ortho Device/Splint Location: trapeze bar patient helper Ortho Device/Splint Interventions: Application   Post Interventions Patient Tolerated: Well Instructions Provided: Care of device   Nikki DomCrawford, Samad Thon 02/22/2018, 11:49 AM

## 2018-02-23 NOTE — Progress Notes (Signed)
Subjective: 3 Days Post-Op Procedure(s) (LRB): IRRIGATION AND DEBRIDEMENT TALUS, ADJUSTMENT OF EXTERNAL FIXATOR,  IRRIGATION AND DEBRIDEMENT OF LEFT KNEE, REPAIR OF LEFT PATELLA (Bilateral) Patient reports pain as mild and moderate.    Objective: Vital signs in last 24 hours: Temp:  [97.6 F (36.4 C)-98.6 F (37 C)] 98.6 F (37 C) (03/31 0500) Pulse Rate:  [68-78] 68 (03/31 0500) Resp:  [17-18] 18 (03/31 0500) BP: (110-142)/(59-83) 110/59 (03/31 0500) SpO2:  [100 %] 100 % (03/31 0500)  Intake/Output from previous day: 03/30 0701 - 03/31 0700 In: 1000 [P.O.:1000] Out: 2600 [Urine:2600] Intake/Output this shift: Total I/O In: 236 [P.O.:236] Out: -   Recent Labs    02/21/18 0849 02/22/18 0732  HGB 8.6* 8.3*   Recent Labs    02/21/18 0849 02/22/18 0732  WBC 11.0* 6.8  RBC 3.15* 3.00*  HCT 25.2* 24.5*  PLT 240 250   Recent Labs    02/21/18 0849 02/22/18 0732  NA 137 139  K 3.5 3.3*  CL 102 103  CO2 26 27  BUN 10 10  CREATININE 0.86 0.95  GLUCOSE 148* 120*  CALCIUM 8.1* 8.3*   No results for input(s): LABPT, INR in the last 72 hours.  Neurovascular intact Sensation intact distally Incision: dressing C/D/I  Assessment/Plan: 3 Days Post-Op Procedure(s) (LRB): IRRIGATION AND DEBRIDEMENT TALUS, ADJUSTMENT OF EXTERNAL FIXATOR,  IRRIGATION AND DEBRIDEMENT OF LEFT KNEE, REPAIR OF LEFT PATELLA (Bilateral)  1. PT/OTWBAT on LLE; no active extension against resistance to extent feasible; no motion restrictions! 2. DVT prophLovenox 3.No further surgery planned this admission; late removal of ex fix at 6-8 wks 4. Rehab as inpatient likely which would be great in my opinion    Margart SicklesJoshua Onnie Hatchel 02/23/2018, 9:57 AM
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
## 2018-02-24 DIAGNOSIS — F411 Generalized anxiety disorder: Secondary | ICD-10-CM

## 2018-02-24 DIAGNOSIS — E876 Hypokalemia: Secondary | ICD-10-CM

## 2018-02-24 DIAGNOSIS — R739 Hyperglycemia, unspecified: Secondary | ICD-10-CM

## 2018-02-24 DIAGNOSIS — T148XXA Other injury of unspecified body region, initial encounter: Secondary | ICD-10-CM

## 2018-02-24 DIAGNOSIS — T380X5A Adverse effect of glucocorticoids and synthetic analogues, initial encounter: Secondary | ICD-10-CM

## 2018-02-24 DIAGNOSIS — G8918 Other acute postprocedural pain: Secondary | ICD-10-CM

## 2018-02-24 DIAGNOSIS — T1490XA Injury, unspecified, initial encounter: Secondary | ICD-10-CM

## 2018-02-24 DIAGNOSIS — T07XXXA Unspecified multiple injuries, initial encounter: Secondary | ICD-10-CM

## 2018-02-24 DIAGNOSIS — D62 Acute posthemorrhagic anemia: Secondary | ICD-10-CM

## 2018-02-24 MED ORDER — HYDROMORPHONE HCL 1 MG/ML IJ SOLN
1.0000 mg | INTRAMUSCULAR | Status: DC | PRN
  Administered 2018-02-24: 1 mg via INTRAVENOUS
  Filled 2018-02-24: qty 2
  Filled 2018-02-24: qty 1

## 2018-02-24 MED ORDER — TRAMADOL HCL 50 MG PO TABS
50.0000 mg | ORAL_TABLET | Freq: Four times a day (QID) | ORAL | Status: DC
  Administered 2018-02-24 – 2018-02-26 (×9): 50 mg via ORAL
  Filled 2018-02-24 (×9): qty 1

## 2018-02-24 MED ORDER — POLYETHYLENE GLYCOL 3350 17 G PO PACK
17.0000 g | PACK | Freq: Two times a day (BID) | ORAL | Status: DC
  Administered 2018-02-24 – 2018-02-26 (×4): 17 g via ORAL
  Filled 2018-02-24 (×4): qty 1

## 2018-02-24 MED ORDER — DIPHENHYDRAMINE HCL 12.5 MG/5ML PO ELIX: 12.5000 mg | ORAL_SOLUTION | Freq: Four times a day (QID) | ORAL | Status: DC | PRN
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
## 2018-02-24 NOTE — Progress Notes (Addendum)
POD # 4 s/p ORIF R talus frx POD #3 s/p repair of left patella   Subjective: Ms. Archie PattenMonroe reports she is comfortable this morning. She is concerned that she has not had a BM in one week. She is also concerned about skin break down to her bottom. PT visited her Friday 02/21/18 but did not know if she was allowed to bend her knee. She reports her bottom feels hot and that she placed ice packs to cool the area. Her pain is 5/10 in the RLE, and 0/10 in the LLE. She denies SOB or chest pain. Foley in place with good urine output. VSS. Tolerating solid food.    Objective:   Vitals:   02/23/18 2141 02/24/18 0456  BP: (!) 110/43 110/62  Pulse: 76 66  Resp: 17 16  Temp: 98.2 F (36.8 C) 98.6 F (37 C)  SpO2: 100% 100%    Intake/Output Summary (Last 24 hours) at 02/24/2018 0743 Last data filed at 02/23/2018 2045 Gross per 24 hour  Intake 476 ml  Output 2350 ml  Net -1874 ml    PE: Gen: NAD, resting comfortably in bed  Card: RRR Pulm: CTABL Abd: soft, hyperactive Bowel sounds.  Ext: RUE: dressing in place over sitched lacerations. No boney deformity, minimal swelling, CDI  RLE wound vac in place, Exfix in place, dressing CDI,  LLE Dressing in place, CDI Skin:  Skin to bottom had pink foam bandages over pressure points Psych: answers questions appropriately,   Lab Results:      Studies/Results:  ImagingResults(Last48hours)  Dg Ankle Complete Right  Result Date: 02/20/2018 CLINICAL DATA:  Adjustment of external fixator EXAM: DG C-ARM 61-120 MIN; RIGHT ANKLE - COMPLETE 3+ VIEW COMPARISON:  02/18/2018 FLUOROSCOPY TIME:  Fluoroscopy Time:  13 seconds Radiation Exposure Index (if provided by the fluoroscopic device): Not available Number of Acquired Spot Images: 4 FINDINGS: Fixation wires again noted traversing the distal fibular fracture. External fixator devices are seen. IMPRESSION: Intraoperative fluoroscopy for fixator adjustment Electronically Signed   By: Alcide CleverMark  Lukens  M.D.   On: 02/20/2018 11:13   Dg Ankle Right Port  Result Date: 02/20/2018 CLINICAL DATA:  External fixator adjustment and irrigation and debridement of the open talus fracture dislocation. EXAM: PORTABLE RIGHT ANKLE - 2 VIEW COMPARISON:  Intraoperative x-rays from same day. CT right ankle dated February 18, 2018. FINDINGS: Unchanged K-wire traversing distal fibular fracture. External fixation device remains in place. Alignment is near anatomic. The talar dome is intact. The ankle mortise is symmetric. Bone mineralization is normal. Wound VAC in place. IMPRESSION: 1. External fixation device remains in place with near anatomic alignment of the ankle. Electronically Signed   By: Obie DredgeWilliam T Derry M.D.   On: 02/20/2018 13:05   Dg C-arm 1-60 Min  Result Date: 02/20/2018 CLINICAL DATA:  Adjustment of external fixator EXAM: DG C-ARM 61-120 MIN; RIGHT ANKLE - COMPLETE 3+ VIEW COMPARISON:  02/18/2018 FLUOROSCOPY TIME:  Fluoroscopy Time:  13 seconds Radiation Exposure Index (if provided by the fluoroscopic device): Not available Number of Acquired Spot Images: 4 FINDINGS: Fixation wires again noted traversing the distal fibular fracture. External fixator devices are seen. IMPRESSION: Intraoperative fluoroscopy for fixator adjustment Electronically Signed   By: Alcide CleverMark  Lukens M.D.   On: 02/20/2018 11:13     Anti-infectives: Antibiotics Given (last 72 hours)    Date/Time Action Medication Dose Rate   02/21/18 1227 New Bag/Given   ceFAZolin (ANCEF) IVPB 1 g/50 mL premix 1 g 100 mL/hr   02/21/18 1757 New  Bag/Given   ceFAZolin (ANCEF) IVPB 1 g/50 mL premix 1 g 100 mL/hr   02/22/18 0354 New Bag/Given   ceFAZolin (ANCEF) IVPB 1 g/50 mL premix 1 g 100 mL/hr   02/22/18 1059 New Bag/Given   ceFAZolin (ANCEF) IVPB 1 g/50 mL premix 1 g 100 mL/hr   02/22/18 1839 New Bag/Given   ceFAZolin (ANCEF) IVPB 1 g/50 mL premix 1 g 100 mL/hr   02/23/18 0146 New Bag/Given   ceFAZolin (ANCEF) IVPB 1 g/50 mL premix 1 g 100  mL/hr   02/23/18 1045 New Bag/Given   ceFAZolin (ANCEF) IVPB 1 g/50 mL premix 1 g 100 mL/hr   02/23/18 1838 New Bag/Given   ceFAZolin (ANCEF) IVPB 1 g/50 mL premix 1 g 100 mL/hr   02/24/18 0133 New Bag/Given   ceFAZolin (ANCEF) IVPB 1 g/50 mL premix 1 g 100 mL/hr       Assessment/Plan  Open L knee fracture - s/p repair of left patella .- PT/OTWBAT on LLE; no active extension against resistance to extent feasible; no motion restrictions! Open R ankle fracture -S/P ORIF R talus frx, wash out of R knee and ankle, ex fix to R ankle, wash out of L knee - s/p repeat debridement right ankle and L knee, retention suture closure of L knee and R ankle - No further surgery planned this admission; late removal of ex fix at 6-8 wks R lateral 10th rib fx with pulm contusion  - pain control, IS, pulm toilet R hand laceration and pain - remove bandage to last two fingers - ask ortho when to remove stitches  - films negative for fracture, S/P washout and wound closure, Handy, 03/25 Pressure sores  - PT consult Abdominal wall contusion - resolved T1 spinous process fracture - PT/OT, pain control Fevers: -  now afebrile - continue to monitor AKI - resolved, Cr. Has returned to baseline,    FEN:reg diet  VTE: SCD's, lovenox ZO:XWRUE 03/25>> Foley:yes Follow AV:[id number]  DISPO:PO pain medication with IV push for breakthrough pain.    Willeen Cass - Medical Student

## 2018-02-24 NOTE — Clinical Social Work Note (Signed)
Clinical Social Worker continuing to follow patient and family for support and discharge planning needs.  Patient assessed by inpatient rehab today and awaiting follow up from admissions coordinator.  Patient continues to verbalize the ability of siblings to provide round the clock support and assistance upon discharge.  CSW remains available for support as needed.  Macario GoldsJesse Skyleen Bentley, [place] [phone]
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
## 2018-02-24 NOTE — Progress Notes (Signed)
Patient lost IV access. PA made aware, no scheduled IV medications at this time. Will continue to monitor.

## 2018-02-25 LAB — CBC
HEMATOCRIT: 26.3 % — AB (ref 36.0–46.0)
Hemoglobin: 8.5 g/dL — ABNORMAL LOW (ref 12.0–15.0)
MCH: 26.5 pg (ref 26.0–34.0)
MCHC: 32.3 g/dL (ref 30.0–36.0)
MCV: 81.9 fL (ref 78.0–100.0)
Platelets: 337 10*3/uL (ref 150–400)
RBC: 3.21 MIL/uL — ABNORMAL LOW (ref 3.87–5.11)
RDW: 13.8 % (ref 11.5–15.5)
WBC: 9.3 10*3/uL (ref 4.0–10.5)

## 2018-02-25 MED ORDER — MAGNESIUM CITRATE PO SOLN
0.5000 | Freq: Once | ORAL | Status: AC
  Administered 2018-02-25: 0.5 via ORAL
  Filled 2018-02-25: qty 296

## 2018-02-25 NOTE — Progress Notes (Signed)
Tech offered Pt a bath. Pt and family stated that the bath can wait until later.
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
## 2018-02-25 NOTE — Progress Notes (Signed)
I met with pt and her sister, Jeannene Patella, at bedside. We discussed inpt rehab goals and expectations. Her home is wheelchair accessible;e except for the bathroom. Family can provide assistance. I will begin insurance authorization for a possible inpt rehab admit pending their approval. They are in agreement. [phone]

## 2018-02-26 ENCOUNTER — Other Ambulatory Visit: Payer: Self-pay

## 2018-02-26 ENCOUNTER — Encounter (HOSPITAL_COMMUNITY): Payer: Self-pay | Admitting: *Deleted

## 2018-02-26 ENCOUNTER — Inpatient Hospital Stay (HOSPITAL_COMMUNITY)
Admission: RE | Admit: 2018-02-26 | Discharge: 2018-03-07 | DRG: 560 | Disposition: A | Discharge status: Home Health | Payer: BC Managed Care – PPO | Source: Intra-hospital | Attending: Physical Medicine & Rehabilitation | Admitting: Physical Medicine & Rehabilitation

## 2018-02-26 DIAGNOSIS — K5901 Slow transit constipation: Secondary | ICD-10-CM | POA: Diagnosis present

## 2018-02-26 DIAGNOSIS — F4310 Post-traumatic stress disorder, unspecified: Secondary | ICD-10-CM | POA: Diagnosis present

## 2018-02-26 DIAGNOSIS — N179 Acute kidney failure, unspecified: Secondary | ICD-10-CM | POA: Diagnosis present

## 2018-02-26 DIAGNOSIS — D62 Acute posthemorrhagic anemia: Secondary | ICD-10-CM | POA: Diagnosis present

## 2018-02-26 DIAGNOSIS — K589 Irritable bowel syndrome without diarrhea: Secondary | ICD-10-CM | POA: Diagnosis present

## 2018-02-26 DIAGNOSIS — F411 Generalized anxiety disorder: Secondary | ICD-10-CM | POA: Diagnosis present

## 2018-02-26 DIAGNOSIS — T07XXXA Unspecified multiple injuries, initial encounter: Secondary | ICD-10-CM | POA: Diagnosis not present

## 2018-02-26 DIAGNOSIS — S27322D Contusion of lung, bilateral, subsequent encounter: Secondary | ICD-10-CM

## 2018-02-26 DIAGNOSIS — Z79899 Other long term (current) drug therapy: Secondary | ICD-10-CM | POA: Diagnosis not present

## 2018-02-26 DIAGNOSIS — S2241XD Multiple fractures of ribs, right side, subsequent encounter for fracture with routine healing: Secondary | ICD-10-CM

## 2018-02-26 DIAGNOSIS — S82002D Unspecified fracture of left patella, subsequent encounter for closed fracture with routine healing: Principal | ICD-10-CM

## 2018-02-26 DIAGNOSIS — E876 Hypokalemia: Secondary | ICD-10-CM | POA: Diagnosis present

## 2018-02-26 DIAGNOSIS — K5903 Drug induced constipation: Secondary | ICD-10-CM

## 2018-02-26 DIAGNOSIS — G8918 Other acute postprocedural pain: Secondary | ICD-10-CM

## 2018-02-26 DIAGNOSIS — E8809 Other disorders of plasma-protein metabolism, not elsewhere classified: Secondary | ICD-10-CM

## 2018-02-26 DIAGNOSIS — M79662 Pain in left lower leg: Secondary | ICD-10-CM | POA: Diagnosis present

## 2018-02-26 DIAGNOSIS — R740 Nonspecific elevation of levels of transaminase and lactic acid dehydrogenase [LDH]: Secondary | ICD-10-CM | POA: Diagnosis not present

## 2018-02-26 DIAGNOSIS — M62838 Other muscle spasm: Secondary | ICD-10-CM

## 2018-02-26 DIAGNOSIS — R7401 Elevation of levels of liver transaminase levels: Secondary | ICD-10-CM

## 2018-02-26 DIAGNOSIS — S92101D Unspecified fracture of right talus, subsequent encounter for fracture with routine healing: Secondary | ICD-10-CM

## 2018-02-26 DIAGNOSIS — M7989 Other specified soft tissue disorders: Secondary | ICD-10-CM | POA: Diagnosis not present

## 2018-02-26 DIAGNOSIS — E46 Unspecified protein-calorie malnutrition: Secondary | ICD-10-CM

## 2018-02-26 DIAGNOSIS — G5701 Lesion of sciatic nerve, right lower limb: Secondary | ICD-10-CM | POA: Diagnosis not present

## 2018-02-26 LAB — CBC
HEMATOCRIT: 28.7 % — AB (ref 36.0–46.0)
HEMOGLOBIN: 9.5 g/dL — AB (ref 12.0–15.0)
MCH: 27.3 pg (ref 26.0–34.0)
MCHC: 33.1 g/dL (ref 30.0–36.0)
MCV: 82.5 fL (ref 78.0–100.0)
Platelets: 424 10*3/uL — ABNORMAL HIGH (ref 150–400)
RBC: 3.48 MIL/uL — ABNORMAL LOW (ref 3.87–5.11)
RDW: 14.4 % (ref 11.5–15.5)
WBC: 8.1 10*3/uL (ref 4.0–10.5)

## 2018-02-26 LAB — CREATININE, SERUM: Creatinine, Ser: 0.99 mg/dL (ref 0.44–1.00)

## 2018-02-26 MED ORDER — ALBUTEROL SULFATE (2.5 MG/3ML) 0.083% IN NEBU: 3.0000 mL | INHALATION_SOLUTION | RESPIRATORY_TRACT | Status: DC | PRN

## 2018-02-26 MED ORDER — METHOCARBAMOL 500 MG PO TABS
500.0000 mg | ORAL_TABLET | Freq: Three times a day (TID) | ORAL | Status: DC
  Administered 2018-02-26 – 2018-03-07 (×26): 500 mg via ORAL
  Filled 2018-02-26 (×26): qty 1

## 2018-02-26 MED ORDER — LORATADINE 10 MG PO TABS
10.0000 mg | ORAL_TABLET | Freq: Every day | ORAL | Status: DC
  Administered 2018-02-27 – 2018-03-07 (×9): 10 mg via ORAL
  Filled 2018-02-26 (×9): qty 1

## 2018-02-26 MED ORDER — TRAMADOL HCL 50 MG PO TABS
50.0000 mg | ORAL_TABLET | Freq: Four times a day (QID) | ORAL | Status: DC
  Administered 2018-02-26 – 2018-03-05 (×26): 50 mg via ORAL
  Filled 2018-02-26 (×28): qty 1

## 2018-02-26 MED ORDER — ONDANSETRON HCL 4 MG/2ML IJ SOLN: 4.0000 mg | Freq: Four times a day (QID) | INTRAMUSCULAR | Status: DC | PRN

## 2018-02-26 MED ORDER — PANTOPRAZOLE SODIUM 40 MG PO TBEC
40.0000 mg | DELAYED_RELEASE_TABLET | Freq: Every day | ORAL | Status: DC
  Administered 2018-02-27 – 2018-03-07 (×9): 40 mg via ORAL
  Filled 2018-02-26 (×9): qty 1

## 2018-02-26 MED ORDER — OXYCODONE HCL 5 MG PO TABS
5.0000 mg | ORAL_TABLET | ORAL | Status: DC | PRN
Start: 2018-02-26 — End: 2018-03-05
  Administered 2018-02-27 – 2018-03-05 (×22): 10 mg via ORAL
  Filled 2018-02-26 (×22): qty 2

## 2018-02-26 MED ORDER — POLYETHYLENE GLYCOL 3350 17 G PO PACK
17.0000 g | PACK | Freq: Two times a day (BID) | ORAL | Status: DC
  Administered 2018-02-26 – 2018-03-04 (×10): 17 g via ORAL
  Filled 2018-02-26 (×17): qty 1

## 2018-02-26 MED ORDER — SORBITOL 70 % SOLN: 30.0000 mL | Freq: Every day | Status: DC | PRN

## 2018-02-26 MED ORDER — ENOXAPARIN SODIUM 40 MG/0.4ML ~~LOC~~ SOLN: 40.0000 mg | SUBCUTANEOUS | Status: DC

## 2018-02-26 MED ORDER — ONDANSETRON HCL 4 MG PO TABS: 4.0000 mg | ORAL_TABLET | Freq: Four times a day (QID) | ORAL | Status: DC | PRN

## 2018-02-26 MED ORDER — ENOXAPARIN SODIUM 40 MG/0.4ML ~~LOC~~ SOLN
40.0000 mg | SUBCUTANEOUS | Status: DC
  Administered 2018-02-27 – 2018-03-07 (×9): 40 mg via SUBCUTANEOUS
  Filled 2018-02-26 (×8): qty 0.4

## 2018-02-26 MED ORDER — FLUTICASONE PROPIONATE 50 MCG/ACT NA SUSP
1.0000 | Freq: Every day | NASAL | Status: DC
  Administered 2018-02-27 – 2018-03-04 (×6): 1 via NASAL
  Administered 2018-03-05 – 2018-03-06 (×2): 2 via NASAL
  Administered 2018-03-07: 1 via NASAL
  Filled 2018-02-26 (×2): qty 16

## 2018-02-26 MED ORDER — ACETAMINOPHEN 325 MG PO TABS: 325.0000 mg | ORAL_TABLET | ORAL | Status: DC | PRN

## 2018-02-26 MED ORDER — MONTELUKAST SODIUM 10 MG PO TABS
10.0000 mg | ORAL_TABLET | Freq: Every day | ORAL | Status: DC
  Administered 2018-02-26 – 2018-03-06 (×9): 10 mg via ORAL
  Filled 2018-02-26 (×9): qty 1

## 2018-02-26 MED ORDER — BUSPIRONE HCL 10 MG PO TABS
10.0000 mg | ORAL_TABLET | Freq: Three times a day (TID) | ORAL | Status: DC
  Administered 2018-02-26 – 2018-03-07 (×26): 10 mg via ORAL
  Filled 2018-02-26 (×2): qty 1
  Filled 2018-02-26: qty 2
  Filled 2018-02-26: qty 1
  Filled 2018-02-26: qty 2
  Filled 2018-02-26 (×4): qty 1
  Filled 2018-02-26: qty 2
  Filled 2018-02-26: qty 1
  Filled 2018-02-26: qty 2
  Filled 2018-02-26: qty 1
  Filled 2018-02-26: qty 2
  Filled 2018-02-26: qty 1
  Filled 2018-02-26: qty 2
  Filled 2018-02-26 (×2): qty 1
  Filled 2018-02-26: qty 2
  Filled 2018-02-26 (×2): qty 1
  Filled 2018-02-26 (×2): qty 2
  Filled 2018-02-26 (×2): qty 1
  Filled 2018-02-26 (×4): qty 2
  Filled 2018-02-26: qty 1
  Filled 2018-02-26: qty 2

## 2018-02-26 MED ORDER — BISACODYL 10 MG RE SUPP: 10.0000 mg | Freq: Every day | RECTAL | Status: DC | PRN

## 2018-02-26 MED ORDER — MAGNESIUM CITRATE PO SOLN
1.0000 | Freq: Once | ORAL | Status: AC
  Administered 2018-02-26: 1 via ORAL
  Filled 2018-02-26: qty 296

## 2018-02-26 MED ORDER — DOCUSATE SODIUM 100 MG PO CAPS
100.0000 mg | ORAL_CAPSULE | Freq: Two times a day (BID) | ORAL | Status: DC
  Administered 2018-02-26 – 2018-03-07 (×17): 100 mg via ORAL
  Filled 2018-02-26 (×18): qty 1
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
## 2018-02-26 NOTE — Progress Notes (Signed)
Patient transferred to in patient rehab. Report given to [place], Charity fundraiser.
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
## 2018-02-26 NOTE — Progress Notes (Signed)
I await insurance approval for possible admit to inpt rehab today. I met with pt and sister at bedside and they aware. [phone]
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
## 2018-02-26 NOTE — Progress Notes (Signed)
Patient ID: Lauren [name], female   DOB: 08/02/1966, 52 y.o.   MRN: [id number] Patient arrived to unit with RN and family. Patient and family oriented to room, bed alarm, and nurse call system. Patient resting in bed with bed alarm on and family at bedside.

## 2018-02-27 ENCOUNTER — Inpatient Hospital Stay (HOSPITAL_COMMUNITY): Payer: BC Managed Care – PPO | Admitting: Occupational Therapy

## 2018-02-27 ENCOUNTER — Inpatient Hospital Stay (HOSPITAL_COMMUNITY): Payer: BC Managed Care – PPO | Admitting: Physical Therapy

## 2018-02-27 ENCOUNTER — Inpatient Hospital Stay (HOSPITAL_COMMUNITY): Payer: BC Managed Care – PPO

## 2018-02-27 ENCOUNTER — Encounter (HOSPITAL_COMMUNITY): Payer: BC Managed Care – PPO

## 2018-02-27 DIAGNOSIS — E46 Unspecified protein-calorie malnutrition: Secondary | ICD-10-CM

## 2018-02-27 DIAGNOSIS — T07XXXA Unspecified multiple injuries, initial encounter: Secondary | ICD-10-CM

## 2018-02-27 DIAGNOSIS — E8809 Other disorders of plasma-protein metabolism, not elsewhere classified: Secondary | ICD-10-CM

## 2018-02-27 DIAGNOSIS — R740 Nonspecific elevation of levels of transaminase and lactic acid dehydrogenase [LDH]: Secondary | ICD-10-CM

## 2018-02-27 DIAGNOSIS — R7401 Elevation of levels of liver transaminase levels: Secondary | ICD-10-CM

## 2018-02-27 DIAGNOSIS — D62 Acute posthemorrhagic anemia: Secondary | ICD-10-CM

## 2018-02-27 DIAGNOSIS — N179 Acute kidney failure, unspecified: Secondary | ICD-10-CM

## 2018-02-27 LAB — COMPREHENSIVE METABOLIC PANEL
ALBUMIN: 2.8 g/dL — AB (ref 3.5–5.0)
ALT: 56 U/L — ABNORMAL HIGH (ref 14–54)
ANION GAP: 9 (ref 5–15)
AST: 50 U/L — ABNORMAL HIGH (ref 15–41)
Alkaline Phosphatase: 89 U/L (ref 38–126)
BILIRUBIN TOTAL: 0.6 mg/dL (ref 0.3–1.2)
BUN: 15 mg/dL (ref 6–20)
CO2: 26 mmol/L (ref 22–32)
Calcium: 8.7 mg/dL — ABNORMAL LOW (ref 8.9–10.3)
Chloride: 100 mmol/L — ABNORMAL LOW (ref 101–111)
Creatinine, Ser: 1.1 mg/dL — ABNORMAL HIGH (ref 0.44–1.00)
GFR calc Af Amer: >60 mL/min (ref >60)
GFR calc non Af Amer: 58 mL/min — ABNORMAL LOW (ref >60)
GLUCOSE: 111 mg/dL — AB (ref 65–99)
POTASSIUM: 4.4 mmol/L (ref 3.5–5.1)
Sodium: 135 mmol/L (ref 135–145)
TOTAL PROTEIN: 6.4 g/dL — AB (ref 6.5–8.1)

## 2018-02-27 LAB — CBC WITH DIFFERENTIAL/PLATELET
Basophils Absolute: 0 10*3/uL (ref 0.0–0.1)
Basophils Relative: 0 %
EOS ABS: 0.2 10*3/uL (ref 0.0–0.7)
EOS PCT: 3 %
HEMATOCRIT: 27.8 % — AB (ref 36.0–46.0)
Hemoglobin: 9.3 g/dL — ABNORMAL LOW (ref 12.0–15.0)
Lymphocytes Relative: 28 %
Lymphs Abs: 2.2 10*3/uL (ref 0.7–4.0)
MCH: 27.6 pg (ref 26.0–34.0)
MCHC: 33.5 g/dL (ref 30.0–36.0)
MCV: 82.5 fL (ref 78.0–100.0)
MONO ABS: 0.6 10*3/uL (ref 0.1–1.0)
MONOS PCT: 8 %
NEUTROS ABS: 4.8 10*3/uL (ref 1.7–7.7)
Neutrophils Relative %: 61 %
Platelets: 447 10*3/uL — ABNORMAL HIGH (ref 150–400)
RBC: 3.37 MIL/uL — ABNORMAL LOW (ref 3.87–5.11)
RDW: 14.9 % (ref 11.5–15.5)
WBC: 7.9 10*3/uL (ref 4.0–10.5)

## 2018-02-27 MED ORDER — PRO-STAT SUGAR FREE PO LIQD
30.0000 mL | Freq: Two times a day (BID) | ORAL | Status: DC
  Administered 2018-02-27 – 2018-03-07 (×17): 30 mL via ORAL
  Filled 2018-02-27 (×17): qty 30

## 2018-02-27 NOTE — Progress Notes (Signed)
Physical Therapy Session Note  Patient Details  Name: Lauren [name] MRN: [id number] Date of Birth: 05/24/1966  Today's Date: 02/27/2018 PT Individual Time: 0830-0900 PT Individual Time Calculation (min): 30 min   Short Term Goals: Week 1:  PT Short Term Goal 1 (Week 1): =LTG due to estimated LOS  Skilled Therapeutic Interventions/Progress Updates:   Handoff from PT on mat in gym. Min assist to come to EOB due to pain and assist with managing RLE due to external fixator. Attempted sit <> stand with RW x 3 attempts but unable to clear bottom and maintain NWB status and increased pain in LLE. Focused on active assisted ROM with pillow case under foot x 10 reps with 3 second hold. Performed slideboard transfer x 2 reps back into w/c and then back into bed with min assist and assist to place slideboard requiring cues for NWB status. Pt able to return to supine with supervision managing BLE without physical assist. BLE elevated on pillows.  Therapy Documentation Precautions:  Precautions Precautions: Fall Required Braces or Orthoses: Other Brace/Splint Other Brace/Splint: External Fixator for 6-8 weeks on RLE Restrictions Weight Bearing Restrictions: Yes RLE Weight Bearing: Non weight bearing LLE Weight Bearing: Weight bearing as tolerated Other Position/Activity Restrictions: L LE WBAT for transfers only  Pain: Premedicated for pain in BLE, L > R. Pt did not rate.    See Function Navigator for Current Functional Status.   Therapy/Group: Individual Therapy  Karolee StampsGray, Anh Mangano Darrol PokeBrescia  Jennesis Ramaswamy B. Kiylah Loyer, PT, DPT  02/27/2018, 9:34 AM
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
## 2018-02-27 NOTE — Care Management (Addendum)
[facility]patient [facility] Individual Statement of Services  Patient Name:  Lauren [name]  Date:  02/27/2018  Welcome to the [facility].  Our goal is to provide you with an individualized program based on your diagnosis and situation, designed to meet your specific needs.  With this comprehensive rehabilitation program, you will be expected to participate in at least 3 hours of rehabilitation therapies Monday-Friday, with modified therapy programming on the weekends.  Your rehabilitation program will include the following services:  Physical Therapy (PT), Occupational Therapy (OT), 24 hour per day rehabilitation nursing, Therapeutic Recreaction (TR), Neuropsychology, Case Management (Social Worker), Rehabilitation Medicine, Nutrition Services and Pharmacy Services  Weekly team conferences will be held on Wednesday to discuss your progress.  Your Social Worker will talk with you frequently to get your input and to update you on team discussions.  Team conferences with you and your family in attendance may also be held.  Expected length of stay: 7-9 days  Overall anticipated outcome: mod/i wheelchair level  Depending on your progress and recovery, your program may change. Your Social Worker will coordinate services and will keep you informed of any changes. Your Social Worker's name and contact numbers are listed  below.  The following services may also be recommended but are not provided by the Inpatient [facility]:   Driving Evaluations  Home Health Rehabiltiation Services  Outpatient Rehabilitation Services  Vocational Rehabilitation   Arrangements will be made to provide these services after discharge if needed.  Arrangements include referral to agencies that provide these services.  Your insurance has been verified to be:  [employer] BCBS & Med Pay Your primary doctor is:  Arlan Organhomas Whyte  Pertinent information will be shared with your doctor and your  insurance company.  Social Worker:  Dossie DerBecky Johnnetta Holstine, SW [phone] or ([phone]) [phone]  Information discussed with and copy given to patient by: [name], [name], 02/27/2018, 10:53 AM
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
## 2018-02-27 NOTE — Progress Notes (Signed)
Occupational Therapy Session Note  Patient Details  Name: Lauren [name] MRN: [id number] Date of Birth: 10/27/1966  Today's Date: 02/27/2018 OT Individual Time: 1350-1447 OT Individual Time Calculation (min): 57 min    Short Term Goals: Week 1:  OT Short Term Goal 1 (Week 1): STGs equal to LTGs based on ELOS  Skilled Therapeutic Interventions/Progress Updates:  Pt completed transfer from bed to wheelchair via sliding board with min assist.  Educated pt on placement of the board and she was able to position it with min assist.  She then rolled her wheelchair down to the ADL apartment for practice with tub/shower transfers.  Completed tub/transfer to the tub bench with min assist using the sliding board as well.  Pt will not be able to initially shower until external fixators are removed, but she will still need a tub bench for months of time.  Had pt next roll to the therapy gym and transfer to the therapy mat with min assist using the sliding board.  In sitting, pt completed simulated donning of LB clothing with use of therapy band and reacher.  She was able to place theraband over both LEs with the reacher and then complete lateral leans side to side to pull them up over her hips.  Finished session with transfer back to the wheelchair and return to the room, with pt pushing the chair herself.  Pt left with sister with call button and phone in reach.     Therapy Documentation Precautions:  Precautions Precautions: Fall Required Braces or Orthoses: Other Brace/Splint Other Brace/Splint: External Fixator for 6-8 weeks on RLE Restrictions Weight Bearing Restrictions: Yes RLE Weight Bearing: Weight bearing as tolerated LLE Weight Bearing: Weight bearing as tolerated Other Position/Activity Restrictions: L LE WBAT for transfers only  Pain: Pain Assessment Pain Scale: Faces Faces Pain Scale: Hurts little more Pain Type: Acute pain Pain Location: Leg Pain Orientation: Right Pain Descriptors /  Indicators: Discomfort Pain Onset: With Activity Pain Intervention(s): Repositioned ADL: See Function Navigator for Current Functional Status.   Therapy/Group: Individual Therapy  Catheryn Slifer OTR/L 02/27/2018, 4:05 PM
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
## 2018-02-28 ENCOUNTER — Inpatient Hospital Stay (HOSPITAL_COMMUNITY): Payer: BC Managed Care – PPO | Admitting: Occupational Therapy

## 2018-02-28 ENCOUNTER — Inpatient Hospital Stay (HOSPITAL_COMMUNITY): Payer: BC Managed Care – PPO | Admitting: Physical Therapy

## 2018-02-28 ENCOUNTER — Inpatient Hospital Stay (HOSPITAL_COMMUNITY): Payer: BC Managed Care – PPO

## 2018-02-28 ENCOUNTER — Inpatient Hospital Stay (HOSPITAL_COMMUNITY)
Admission: RE | Admit: 2018-02-28 | Discharge: 2018-02-28 | Disposition: A | Discharge status: Home / Self Care | Payer: BC Managed Care – PPO | Source: Intra-hospital | Attending: Physician Assistant | Admitting: Physician Assistant

## 2018-02-28 DIAGNOSIS — G5701 Lesion of sciatic nerve, right lower limb: Secondary | ICD-10-CM

## 2018-02-28 DIAGNOSIS — M7989 Other specified soft tissue disorders: Secondary | ICD-10-CM

## 2018-02-28 MED ORDER — DIPHENHYDRAMINE HCL 25 MG PO CAPS
25.0000 mg | ORAL_CAPSULE | ORAL | Status: DC | PRN
  Administered 2018-02-28 – 2018-03-05 (×6): 25 mg via ORAL
  Filled 2018-02-28 (×6): qty 1

## 2018-02-28 NOTE — Progress Notes (Signed)
Occupational Therapy Session Note  Patient Details  Name: Lauren [name] MRN: [id number] Date of Birth: 11/19/1966  Today's Date: 02/28/2018 OT Individual Time: 9528-41320900-0944 OT Individual Time Calculation (min): 44 min    Short Term Goals: Week 1:  OT Short Term Goal 1 (Week 1): STGs equal to LTGs based on ELOS  Skilled Therapeutic Interventions/Progress Updates:    Pt received sitting up in w/c in room with family present and c/o pain as detailed below. Pt  Set up at sink level  To complete UB bathing and dressing. Discussion with pt re transfer technique and dressing from sitting EOB or supine d/t WB restrictions. Pt required vc for positioning of slideboard and technique and transferred EOB with (S) overall. Pt able to weightshift R and L with vc and doff pants from seated level. Pt demonstrated good progress in distal LE dressing and was able to don/doff L sock. Pt able to perform posterior and anterior peri-hygiene sitting EOB by weight shifting laterally. Pt then completed 2 sets of sitting EOB unsupported theraband B UE exercises x15 B, with a focus on scapular retraction/protraction and tricep extension in prep for more endurance/strength for ADL transfers. Pt left supine in bed with bed alarm set and family present.   Therapy Documentation Precautions:  Precautions Precautions: Fall Required Braces or Orthoses: Other Brace/Splint Other Brace/Splint: External Fixator for 6-8 weeks on RLE Restrictions Weight Bearing Restrictions: Yes RLE Weight Bearing: Non weight bearing LLE Weight Bearing: Weight bearing as tolerated Other Position/Activity Restrictions: L LE WBAT for transfers only  Pain: Pain Assessment Pain Score: 9  Pain Location: Foot Pain Orientation: Right Pain Descriptors / Indicators: Aching Pain Intervention(s): Repositioned  See Function Navigator for Current Functional Status.   Therapy/Group: Individual Therapy  Lauren [name] 02/28/2018, 10:31 AM
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
## 2018-02-28 NOTE — Progress Notes (Signed)
Bilateral lower extremity venous duplex has been completed. Negative for DVT.  02/28/18 3:27 PM Olen CordialGreg Lyrick Worland RVT

## 2018-02-28 NOTE — Progress Notes (Signed)
Physical Therapy Session Note  Patient Details  Name: Lauren [name] MRN: [id number] Date of Birth: 07/05/1966  Today's Date: 02/28/2018 PT Individual Time: 0800-0900 PT Individual Time Calculation (min): 60 min   Short Term Goals: Week 1:  PT Short Term Goal 1 (Week 1): =LTG due to estimated LOS  Skilled Therapeutic Interventions/Progress Updates: Pt received seated in bed, c/o pain as below and agreeable to treatment. Supine>sit with HOB elevated and S. Lateral scoot transfer with transfer board into w/c with min guard and assist for placing board. Pt performs oral hygiene at sink with modI. RN present to administer pain medication. W/c propulsion modI in unit. Performed couch transfer with slideboard and min guard, min cues for technique. Lateral scoot w/c >mat table with transfer board and S. Provided pt and sister with home measurement sheet and discussed measurement of potential problem areas to allow therapists to simulate and prepare for d/c; pt's sister to return sheet at the beginning of next week. Instructed pt in exercises listed below and provided handout for continued performance independently over the weekend and at d/c.  Exercises  Seated Long Arc Quad - 10 reps - 3 sets - 2 hold - 1x daily - 7x weekly  Seated March - 10 reps - 3 sets - 1x daily - 7x weekly  Seated Hip Adduction Isometrics with Ball - 10 reps - 3 sets - 2 hold - 1x daily - 7x weekly  Seated Hip Abduction with Resistance - 10 reps - 3 sets - 2 hold - 1x daily - 7x weekly  Supine Heel Slides - 10 reps - 3 sets - 1x daily - 7x weekly  Supine Active Straight Leg Raise - 10 reps - 3 sets - 1x daily - 7x weekly    Returned to w/c with transfer board close S. W/c propulsion to return to room; remained in w/c, sister present and all needs in reach.       Therapy Documentation Precautions:  Precautions Precautions: Fall Required Braces or Orthoses: Other Brace/Splint Other Brace/Splint: External Fixator for 6-8  weeks on RLE Restrictions Weight Bearing Restrictions: Yes RLE Weight Bearing: Non weight bearing LLE Weight Bearing: Weight bearing as tolerated Other Position/Activity Restrictions: L LE WBAT for transfers only Pain: Pain Assessment Pain Scale: 0-10 Pain Score: 4  Pain Type: Acute pain Pain Location: Leg Pain Orientation: Right Pain Descriptors / Indicators: Aching Pain Frequency: Intermittent Pain Onset: On-going Pain Intervention(s): Medication (See eMAR)   See Function Navigator for Current Functional Status.   Therapy/Group: Individual Therapy  Vista Lawmanlizabeth J Tygielski 02/28/2018, 8:54 AM

## 2018-02-28 NOTE — Progress Notes (Signed)
Occupational Therapy Session Note  Patient Details  Name: Lauren [name] MRN: [id number] Date of Birth: 11/05/1966  Today's Date: 02/28/2018 OT Individual Time: 1104-1200 OT Individual Time Calculation (min): 56 min    Short Term Goals: Week 1:  OT Short Term Goal 1 (Week 1): STGs equal to LTGs based on ELOS  Skilled Therapeutic Interventions/Progress Updates:    Session 1:  (1104-1200)  Pt completed sliding board transfer from bed to wheelchair with min guard assist using the sliding board.  Once in the chair, she was able to propel herself down to the dayroom with supervision, with min instructional cueing to slow down and pace herself to prevent fatigue.  Once down in the dayroom she then completed 3 sets of 5 mins each with RPMs maintained at 25-30.  First set with resistance on level 8 and last 2 sets with resistance on level 10.  Last set was completed by peddling posterior.  Rest breaks of 2-3 mins between each sets with oxygen sats maintained at 99-100% on room air.  She then propelled her wheelchair back to the room with supervision and increased time secondary to fatigue.  She stayed up in the wheelchair with family present in preparation for lunch.    Session 2:  [phone](1415-1446) Pt worked on toilet transfers to the drop arm commode with min assist using the sliding board.  She was able to position the board and scoot across with min assist for stabilizing the board secondary to it moving around slightly.  Once on the seat she was able to complete clothing management and hygiene in sitting with lateral leans.  She needed mod assist for pulling pants back over her hips to conclude toileting.  She transferred back over to the bed with min assist via sliding board and then into the bed with supervision.  Finished session with 1 set on each arm of triceps extension for 15 reps with medium resistance orange therapy band and one set of shoulder flexion for 5 reps for each side.  Pt left with call button  and phone in reach and family present.    Therapy Documentation Precautions:  Precautions Precautions: Fall Required Braces or Orthoses: Other Brace/Splint Other Brace/Splint: External Fixator for 6-8 weeks on RLE Restrictions Weight Bearing Restrictions: Yes RLE Weight Bearing: Non weight bearing LLE Weight Bearing: Weight bearing as tolerated Other Position/Activity Restrictions: L LE WBAT for transfers only  Pain: Pain Assessment Pain Scale: Faces Faces Pain Scale: Hurts little more Pain Type: Surgical pain Pain Location: Leg Pain Orientation: Right Pain Descriptors / Indicators: Discomfort Pain Onset: With Activity Pain Intervention(s): Emotional support;Repositioned ADL: See Function Navigator for Current Functional Status.   Therapy/Group: Individual Therapy  Lizmarie Witters OTR/L 02/28/2018, 3:44 PM
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
## 2018-03-01 ENCOUNTER — Inpatient Hospital Stay (HOSPITAL_COMMUNITY): Payer: BC Managed Care – PPO | Admitting: Physical Therapy

## 2018-03-01 DIAGNOSIS — F411 Generalized anxiety disorder: Secondary | ICD-10-CM

## 2018-03-01 LAB — GLUCOSE, CAPILLARY: Glucose-Capillary: 111 mg/dL — ABNORMAL HIGH (ref 65–99)

## 2018-03-01 NOTE — Progress Notes (Signed)
Physical Therapy Session Note  Patient Details  Name: Lauren [name] MRN: [id number] Date of Birth: December 17, 1965  Today's Date: 03/01/2018 PT Individual Time: 1610-1705 PT Individual Time Calculation (min): 55 min   Short Term Goals: Week 1:  PT Short Term Goal 1 (Week 1): =LTG due to estimated LOS  Skilled Therapeutic Interventions/Progress Updates:   Pt received supine in bed and agreeable to PT. Supine>sit transfer with supervision assist and min cues for safety.    SB transfer to [facility] with min assist to position and stabilize board, min cues for BLE management to prevent WB through the RLE.   WC mobility over level surface of hospital hall x 445f. Supervision assist from PT with min cues for safety around obstacles. Additional WC mobility over unlevel surface of cement sidewalk at hospital entrance 2 x 1582f Min cues for improved posture to improve safety and technique to improve safety on down hill grade. . Marland Kitchen BLE therex instructed by PT with min cues for proper ROM and safety. LAQ x 10 , AAROM hip abduction x 15, AAROM hip adduction x 12 , hip flexion x 15, ankle DF/PF LLE x 15.   [facility] to [facility] and [facility] reach and all needs met.        Therapy Documentation Precautions:  Precautions Precautions: Fall Required Braces or Orthoses: Other Brace/Splint Other Brace/Splint: External Fixator for 6-8 weeks on RLE Restrictions Weight Bearing Restrictions: Yes RLE Weight Bearing: Non weight bearing LLE Weight Bearing: Weight bearing as tolerated Other Position/Activity Restrictions: L LE WBAT for transfers only    Vital Signs: Therapy Vitals Temp: 98 F (36.7 C) Temp Source: Oral Pulse Rate: 67 Resp: 18 BP: (!) 101/56 Patient Position (if appropriate): Lying Oxygen Therapy SpO2: 100 % O2 Device: Room Air Pain: Pain Assessment Pain Scale: 0-10 Pain Score: 8  Pain Type: Acute pain Pain Location: Foot Pain Orientation: Right Pain  Descriptors / Indicators: Aching Pain Frequency: Constant Pain Onset: On-going Pain Intervention(s): Medication (See eMAR)  See Function Navigator for Current Functional Status.   Therapy/Group: Individual Therapy  AuLorie Phenix/04/2018, 5:22 PM
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
## 2018-03-02 ENCOUNTER — Inpatient Hospital Stay (HOSPITAL_COMMUNITY): Payer: BC Managed Care – PPO | Admitting: Occupational Therapy

## 2018-03-02 ENCOUNTER — Inpatient Hospital Stay (HOSPITAL_COMMUNITY): Payer: BC Managed Care – PPO | Admitting: Physical Therapy

## 2018-03-02 NOTE — Progress Notes (Addendum)
Physical Therapy Note  Patient Details  Name: Lauren [name] MRN: [id number] Date of Birth: 11/19/1966 Today's Date: 03/02/2018    Time: 700-757 57 minutes  1:1 Pt c/o "spasms" throughout session but states she rec'd pain meds prior to session.  Pt performs w/c mobility throughout unit with mod I, supervision and occasional min A for parts management and set up for transfers.  Pt performs sliding board transfers throughout session with set up assist only.  Pt performs supine therex 2 x 12 SLR, SAQ, hip abd/add, heel slides, quad sets, seated LAQ.  UBE for UE strength and endurance 4 min fwd/4 min bkwd level 3.  Pt left in bed with needs at hand, friend present.  Time 2: 1100-1130 30 minutes  1:1 pt c/o LE pain, meds due after session.  Pt performed w/c mobility in controlled environments, ramp negotiation, outdoors on sidewalk and uneven surfaces with supervision, frequent rest breaks due to UE fatigue.  Pt pleased she is able to propel w/c further today than yesterday before needing a rest break.  Pt requires cues for wt shifts when up/down inclines.  Pt given w/c gloves to assist with w/c propulsion.  Pt left in bed with needs at hand.   Lauren [name] 03/02/2018, 7:58 AM

## 2018-03-02 NOTE — Progress Notes (Signed)
Occupational Therapy Session Note  Patient Details  Name: Lauren [name] MRN: [id number] Date of Birth: 03/11/1966  Today's Date: 03/02/2018 OT Individual Time: [id number] and 1456-1540 OT Individual Time Calculation (min): 59 min and 44 min  Short Term Goals: Week 1:  OT Short Term Goal 1 (Week 1): STGs equal to LTGs based on ELOS    Skilled Therapeutic Interventions/Progress Updates:    Pt greeted supine in bed with visitor present. Amenable to B/D. Tx focus on dynamic balance, AE training, functional transfers, and adherence to precautions during bathing and dressing tasks EOB. She completed these tasks with overall supervision, using reacher and stool as needed for reaching feet. Laterals leans for perihygiene with pt mindful of precautions. Slideboard transfer<w/c completed with supervision with pt setting up her own board and applying leg rests afterwards with min vcs. Setup for grooming/oral care tasks w/c level. For remainder of session she assisted therapist with bedmaking tasks w/c level for endurance purposes. Pt left in w/c with all needs within reach at session exit.   2nd Session 1:1 tx (44 min) Pt greeted in bed with visitors present. Requesting to work on toileting and TTB transfers. Slideboard<drop arm BSC completed with supervision while OT steadied commode. Worked on Teaching laboratory technicianclothing mgt with large shorts. She c/o not having enough space on BSC to complete clothing mgt/hygiene. After active collaboration, we found solutions, including dropping 1 arm of BSC and leaning onto supportive surface to complete these simulated tasks. Pt able to adhere to her precautions throughout. Next she managed her leg rests with setup, and self propelled to large tub room. Pt practiced TTB transfers with use of slideboard and steady assist. Discussed d/c plans/recommendations regarding showering, with education emphasis placed on waiting for MD clearance before attempting at home. Pt self propelled back to room  and was left in care of RN.     Therapy Documentation Precautions:  Precautions Precautions: Fall Required Braces or Orthoses: Other Brace/Splint Other Brace/Splint: External Fixator for 6-8 weeks on RLE Restrictions Weight Bearing Restrictions: Yes RLE Weight Bearing: Non weight bearing LLE Weight Bearing: Weight bearing as tolerated Other Position/Activity Restrictions: L LE WBAT for transfers only Pain: Pain Assessment Pain Scale: 0-10 Pain Score: 3  Pain Type: Acute pain Pain Intervention(s): Medication (See eMAR) ADL:  :    See Function Navigator for Current Functional Status.  Therapy/Group: Individual Therapy  Eustolia Drennen A Edeline Greening 03/02/2018, 12:34 PM
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
## 2018-03-03 ENCOUNTER — Inpatient Hospital Stay (HOSPITAL_COMMUNITY): Payer: BC Managed Care – PPO

## 2018-03-03 ENCOUNTER — Inpatient Hospital Stay (HOSPITAL_COMMUNITY): Payer: BC Managed Care – PPO | Admitting: Occupational Therapy

## 2018-03-03 ENCOUNTER — Inpatient Hospital Stay (HOSPITAL_COMMUNITY): Payer: BC Managed Care – PPO | Admitting: Physical Therapy

## 2018-03-03 NOTE — Progress Notes (Signed)
Occupational Therapy Session Note  Patient Details  Name: Lauren [name] MRN: [id number] Date of Birth: 09/13/1966  Today's Date: 03/03/2018 OT Individual Time: 1115-1200 OT Individual Time Calculation (min): 45 min    Short Term Goals: Week 1:  OT Short Term Goal 1 (Week 1): STGs equal to LTGs based on ELOS  Skilled Therapeutic Interventions/Progress Updates:    Pt transitioned easily from PT session with no c/o pain. Her sister is present for hands on caregiver training with functional transfers. Pt set up wheelchair with increased time and placed slide board. Caregiver present for safety and Pt transferred self from wheelchair <> drop arm commode chair. Pt performing lateral leans for clothing management and hygiene with overall supervision as well. Pt returning to wheelchair for hand hygiene at sink. OT asking several questions regarding home set up and recommended equipment. OT stressed importance of having ramp in place for pt and either hospital bed or removal of box spring as bed is currently 31 inches high. Caregiver verbalized understanding. Pt remained in wheelchair at end of session with lunch tray set up. OT provided information to social worker in preparation for discharge.   Therapy Documentation Precautions:  Precautions Precautions: Fall Required Braces or Orthoses: Other Brace/Splint Other Brace/Splint: External Fixator for 6-8 weeks on RLE Restrictions Weight Bearing Restrictions: Yes RLE Weight Bearing: Non weight bearing LLE Weight Bearing: Weight bearing as tolerated Other Position/Activity Restrictions: L LE WBAT for transfers only  See Function Navigator for Current Functional Status.   Therapy/Group: Individual Therapy  Alen BleacherBradsher, Celester Morgan P 03/03/2018, 12:32 PM

## 2018-03-03 NOTE — Progress Notes (Signed)
Physical Therapy Session Note  Patient Details  Name: Lauren [name] MRN: [id number] Date of Birth: 01/19/1966  Today's Date: 03/03/2018 PT Individual Time: 1030-1115 PT Individual Time Calculation (min): 45 min   Short Term Goals: Week 1:  PT Short Term Goal 1 (Week 1): =LTG due to estimated LOS  Skilled Therapeutic Interventions/Progress Updates:    session focused on d/c planning and initiation of family education with focus on hands on transfer training and w/c parts management with sister, Lauren [name]. PT demonstrated transfers first and educated on safe body positioning and set-up of transfers and then Lauren [name] able to return demonstrate with pt directing care as needed for bed <> w/c transfers and w/c <> simulated car transfers. Discussed differences in the car transfer and educated on ways to modify for improved room for LE's as well as increased fall risk due to nature of transfer. Recommended use of sedan for more level surface transfer. Both in agreement and verbalized understanding. Pt able to perform transfers at overall supervision level including board placement but requires cues for safe positioning of board placement and positioning of w/c. Steadying assist during car transfer 1 episode. Lauren [name] with questions about ramp, stating someone was coming out to the house today to look at it but also requested other options. Gave information about [employer] or that prior patients have used [employer] options for portable ramps. Primary PT made aware as well. Handoff to OT at end of session to continue with family training. Pt quickly approaching goals.   Therapy Documentation Precautions:  Precautions Precautions: Fall Required Braces or Orthoses: Other Brace/Splint Other Brace/Splint: External Fixator for 6-8 weeks on RLE Restrictions Weight Bearing Restrictions: Yes RLE Weight Bearing: Non weight bearing LLE Weight Bearing: Weight bearing as tolerated Other Position/Activity Restrictions: L LE WBAT for  transfers only    Pain: Pain in left bottom at end of session like a cramp. Educated on repositioning and pressure relief.    See Function Navigator for Current Functional Status.   Therapy/Group: Individual Therapy  Karolee StampsGray, Twain Stenseth Darrol PokeBrescia  Maryah Marinaro B. Dyonna Jaspers, PT, DPT  03/03/2018, 11:56 AM

## 2018-03-03 NOTE — Progress Notes (Signed)
Occupational Therapy Session Note  Patient Details  Name: Lauren [name] MRN: [id number] Date of Birth: 04/20/1966  Today's Date: 03/03/2018 OT Individual Time: 0815-0915 OT Individual Time Calculation (min): 60 min    Short Term Goals: Week 1:  OT Short Term Goal 1 (Week 1): STGs equal to LTGs based on ELOS  Skilled Therapeutic Interventions/Progress Updates:    1:1. Pt agreeable to bathing EOB. Pt complete bathing using lateral leans with set up/supervision using reacher to doff sock and thread LLE. Pt audibly winded after lateral leaning d/t decreased endurance. Pt transfers to EOB<>w/c<>TTB using slide board with supervision with VC for how to check for board placement between legs. During practice of TTB transfer OT uses dycem underneath board to minimize sliding and pt prefers and feels board is more stable. Educated pt to use dycem when transporting objects on lap to steady objects on legs when propelling w/c. Exited session with pt seated in bed with call light in reach and all needs met  Therapy Documentation Precautions:  Precautions Precautions: Fall Required Braces or Orthoses: Other Brace/Splint Other Brace/Splint: External Fixator for 6-8 weeks on RLE Restrictions Weight Bearing Restrictions: Yes RLE Weight Bearing: Non weight bearing LLE Weight Bearing: Weight bearing as tolerated Other Position/Activity Restrictions: L LE WBAT for transfers only General:    See Function Navigator for Current Functional Status.   Therapy/Group: Individual Therapy  Stephanie M Schlosser 03/03/2018, 8:27 AM  

## 2018-03-03 NOTE — Plan of Care (Signed)
  Problem: Consults Goal: RH GENERAL PATIENT EDUCATION Description See Patient Education module for education specifics. Outcome: Progressing Goal: Skin Care Protocol Initiated - if Braden Score 18 or less Description If consults are not indicated, leave blank or document N/A Outcome: Progressing   Problem: RH BOWEL ELIMINATION Goal: RH STG MANAGE BOWEL WITH ASSISTANCE Description STG Manage Bowel with Assistance. Outcome: Progressing Flowsheets (Taken 03/03/2018 1430) STG: Pt will manage bowels with assistance: 6-Modified independent Goal: RH STG MANAGE BOWEL W/MEDICATION W/ASSISTANCE Description STG Manage Bowel with Medication with Assistance. Outcome: Progressing   Problem: RH BLADDER ELIMINATION Goal: RH STG MANAGE BLADDER WITH ASSISTANCE Description STG Manage Bladder With Assistance Outcome: Progressing Flowsheets (Taken 03/03/2018 1430) STG: Pt will manage bladder with assistance: 6-Modified independent Goal: RH STG MANAGE BLADDER WITH EQUIPMENT WITH ASSISTANCE Description STG Manage Bladder With Equipment With Assistance Outcome: Progressing Flowsheets (Taken 03/03/2018 1430) STG: Pt will manage bladder with equipment with assistance: 6-Modified independent   Problem: RH SKIN INTEGRITY Goal: RH STG SKIN FREE OF INFECTION/BREAKDOWN Outcome: Progressing Goal: RH STG MAINTAIN SKIN INTEGRITY WITH ASSISTANCE Description STG Maintain Skin Integrity With Assistance. Outcome: Progressing Flowsheets (Taken 03/03/2018 1430) STG: Maintain skin integrity with assistance: 4-Minimal assistance Goal: RH STG ABLE TO PERFORM INCISION/WOUND CARE W/ASSISTANCE Description STG Able To Perform Incision/Wound Care With Assistance. Outcome: Progressing Flowsheets (Taken 03/03/2018 1430) STG: Pt will be able to perform incision/wound care with assistance: 4-Minimal assistance   Problem: RH SAFETY Goal: RH STG ADHERE TO SAFETY PRECAUTIONS W/ASSISTANCE/DEVICE Description STG Adhere to  Safety Precautions With Assistance/Device. Outcome: Progressing Flowsheets (Taken 03/03/2018 1430) STG:Pt will adhere to safety precautions with assistance/device: 4-Minimal assistance   Problem: RH PAIN MANAGEMENT Goal: RH STG PAIN MANAGED AT OR BELOW PT'S PAIN GOAL Outcome: Progressing   Problem: RH KNOWLEDGE DEFICIT GENERAL Goal: RH STG INCREASE KNOWLEDGE OF SELF CARE AFTER HOSPITALIZATION Outcome: Progressing

## 2018-03-03 NOTE — Progress Notes (Signed)
Physical Therapy Session Note  Patient Details  Name: Lauren [name] MRN: [id number] Date of Birth: 04/06/1966  Today's Date: 03/03/2018 PT Individual Time: 1400-1500 PT Individual Time Calculation (min): 60 min   Short Term Goals: Week 1:  PT Short Term Goal 1 (Week 1): =LTG due to estimated LOS  Skilled Therapeutic Interventions/Progress Updates: Pt received seated in bed, sister Pam present; c/o pain as below and agreeable to treatment. Reviewed earlier discussion with other PT regarding ramp for entry into sisters home, and lowering bed to make more accessible. Supine>sit modI. Transfer bed>w/c with transfer board and S. W/c propulsion indoors/outdoors with S and min cues for safety regarding speed on uneven surfaces. Lateral scoot transfer w/c <>bench with close S, min cues to maintain NWB RLE. Performed car transfer with sister providing setupA to place and stabilize transfer board; no cues required from therapist regarding setup or technique. Lateral scoot w/c <>mat table with S. BLE strengthening exercises including long arc quad, heel slides, straight leg raises BLE x15 reps each. Returned to w/c as above; w/c propulsion to return to room modI at end of session, all needs in reach.      Therapy Documentation Precautions:  Precautions Precautions: Fall Required Braces or Orthoses: Other Brace/Splint Other Brace/Splint: External Fixator for 6-8 weeks on RLE Restrictions Weight Bearing Restrictions: Yes RLE Weight Bearing: Non weight bearing LLE Weight Bearing: Weight bearing as tolerated Other Position/Activity Restrictions: L LE WBAT for transfers only Pain: Pain Assessment Pain Scale: 0-10 Pain Score: 4  Faces Pain Scale: Hurts a little bit Pain Type: Acute pain Pain Location: Foot Pain Orientation: Right Pain Intervention(s): Medication (See eMAR)   See Function Navigator for Current Functional Status.   Therapy/Group: Individual Therapy  Vista Lawmanlizabeth J  Tygielski 03/03/2018, 2:51 PM
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
## 2018-03-04 ENCOUNTER — Encounter (HOSPITAL_COMMUNITY): Payer: BC Managed Care – PPO | Admitting: Psychology

## 2018-03-04 ENCOUNTER — Inpatient Hospital Stay (HOSPITAL_COMMUNITY): Payer: BC Managed Care – PPO | Admitting: Physical Therapy

## 2018-03-04 ENCOUNTER — Inpatient Hospital Stay (HOSPITAL_COMMUNITY): Payer: BC Managed Care – PPO

## 2018-03-04 ENCOUNTER — Inpatient Hospital Stay (HOSPITAL_COMMUNITY): Payer: BC Managed Care – PPO | Admitting: Occupational Therapy

## 2018-03-04 NOTE — Progress Notes (Signed)
Physical Therapy Session Note  Patient Details  Name: Lauren [name] MRN: [id number] Date of Birth: 10/13/1966  Today's Date: 03/04/2018 PT Individual Time: 1130-1200 PT Individual Time Calculation (min): 30 min   Short Term Goals: Week 1:  PT Short Term Goal 1 (Week 1): =LTG due to estimated LOS  Skilled Therapeutic Interventions/Progress Updates:    session focused on functional transfers w/c <> mat, bed mobility on flat mat table, and supine therex for functional strengthening and ROM including SAQ, SLR, and heel slides x 15 reps each except 13 reps of heel slides due to fatigue and L knee pain. Pt performed transfers at supervision approaching modified independent with slideboard demonstrating improved safety with slideboard placement. W/c propulsion for functional endurance modified independent.   Therapy Documentation Precautions:  Precautions Precautions: Fall Required Braces or Orthoses: Other Brace/Splint Other Brace/Splint: External Fixator for 6-8 weeks on RLE Restrictions Weight Bearing Restrictions: Yes RLE Weight Bearing: Non weight bearing LLE Weight Bearing: Weight bearing as tolerated Other Position/Activity Restrictions: L LE WBAT for transfers only  Pain: Premedicated for pain in L knee.    See Function Navigator for Current Functional Status.   Therapy/Group: Individual Therapy  Karolee StampsGray, Christl Fessenden Darrol PokeBrescia  Elexia Friedt B. Makeyla Govan, PT, DPT  03/04/2018, 12:05 PM

## 2018-03-04 NOTE — Progress Notes (Signed)
Occupational Therapy Session Note  Patient Details  Name: Lauren [name] MRN: [id number] Date of Birth: 08/20/1966  Today's Date: 03/04/2018 OT Individual Time: 1030-1130 OT Individual Time Calculation (min): 60 min    Short Term Goals: Week 1:  OT Short Term Goal 1 (Week 1): STGs equal to LTGs based on ELOS  Skilled Therapeutic Interventions/Progress Updates:    Pt received in bed, she stated she got bathed and dressed earlier in PT session. Discussed pt's main concerns about discharge which were her difficulty managing clothing and fully cleansing when sitting [facility] the [facility].  Pt will need a wide drop arm BSC for home but at this time there is not one available for her to try.  Pt attempted a squat pivot to regular drop arm BSC but was not able to get enough of a lift to move efficiently. With the slide board, she transferred with S.  [facility] she can can get pants down but can only get pants up past her lower buttocks. She was able to slide on board to w/c. With both arms of chair swung back pt had more room for lateral leans to pull her pants fully over her hips.  With the wide drop arm BSC, pt should be able to do this too.  From w/c pt self propelled to day room to use arm bike for 15 minutes. She then returned to her room for her next PT session.  Therapy Documentation Precautions:  Precautions Precautions: Fall Required Braces or Orthoses: Other Brace/Splint Other Brace/Splint: External Fixator for 6-8 weeks on RLE Restrictions Weight Bearing Restrictions: Yes RLE Weight Bearing: Non weight bearing LLE Weight Bearing: Weight bearing as tolerated Other Position/Activity Restrictions: L LE WBAT for transfers only    Vital Signs: Therapy Vitals Temp: 98.3 F (36.8 C) Temp Source: Oral Pulse Rate: 68 Resp: 17 BP: (!) 92/56 Patient Position (if appropriate): Lying Oxygen Therapy SpO2: 100 % O2 Device: Room Air Pain: Pain Assessment Pain Scale: 0-10 Pain Score: 5  Pain Type:  Surgical pain Pain Location: Foot Pain Orientation: Right Pain Descriptors / Indicators: Aching Pain Frequency: Intermittent Pain Onset: On-going Patients Stated Pain Goal: 1 Pain Intervention(s): Medication (See eMAR) ADL:    See Function Navigator for Current Functional Status.   Therapy/Group: Individual Therapy  Rissa Turley 03/04/2018, 8:30 AM

## 2018-03-04 NOTE — Progress Notes (Signed)
Physical Therapy Session Note  Patient Details  Name: Lauren [name] MRN: [id number] Date of Birth: 09/21/1966  Today's Date: 03/04/2018 PT Individual Time: 0800-0900 PT Individual Time Calculation (min): 60 min   Short Term Goals: Week 1:  PT Short Term Goal 1 (Week 1): =LTG due to estimated LOS  Skilled Therapeutic Interventions/Progress Updates: Pt received seated in bed, c/o pain 5/10 in LEs as below and reports she "may have overdone it yesterday" and feels sore; otherwise agreeable to treatment. Pt requests to bathe sitting EOB; performs with S and required pt to direct care and instruct therapist in setup of needs. Utilizes reacher to assist with dressing. Pt reports pastor contacted her last night and is working on a ramp for home entry, however pt reports she did not receive a timeline, "when he wants things done he gets it done quick". Encouraged pt to follow up regarding schedule for getting ramp installed, and possibility of teaching family to bump in w/c if necessary until ramp can be built. Transferred current w/c with specialty back and cushion to standard chair with sling back and basic cushion to allow pt to get used to chair she would go home with. New chair standard seat to floor height, pt had practiced car transfers from hemi-height w/c; alerted CSW to request to order hemi-height w/c dt short leg length and car transfer performed from that height successfully. Performed car transfer from new chair with S however increased effort required for slight uphill transfer back to chair. W/c propulsion modI 2x150'. Performed UE ergometer x7 min level 10 for strengthening and aerobic endurance. Propelled back to room modI at end of session, all needs in reach in room.      Therapy Documentation Precautions:  Precautions Precautions: Fall Required Braces or Orthoses: Other Brace/Splint Other Brace/Splint: External Fixator for 6-8 weeks on RLE Restrictions Weight Bearing Restrictions:  Yes RLE Weight Bearing: Non weight bearing LLE Weight Bearing: Weight bearing as tolerated Other Position/Activity Restrictions: L LE WBAT for transfers only Pain: Pain Assessment Pain Scale: 0-10 Pain Score: 5  Pain Type: Surgical pain Pain Location: Foot Pain Orientation: Right Pain Descriptors / Indicators: Aching Pain Frequency: Intermittent Pain Onset: On-going Patients Stated Pain Goal: 1 Pain Intervention(s): Medication (See eMAR)  See Function Navigator for Current Functional Status.   Therapy/Group: Individual Therapy  Vista Lawmanlizabeth J Tygielski 03/04/2018, 8:57 AM
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
## 2018-03-04 NOTE — Progress Notes (Signed)
Occupational Therapy Session Note  Patient Details  Name: Lauren [name] MRN: [id number] Date of Birth: 15-Apr-1966  Today's Date: 03/04/2018 OT Individual Time: 4949-4473 OT Individual Time Calculation (min): 55 min    Short Term Goals: Week 1:  OT Short Term Goal 1 (Week 1): STGs equal to LTGs based on ELOS  Skilled Therapeutic Interventions/Progress Updates:    Pt received supine in bed with family present and agreeable to therapy. Set up provided for sliding board transfer into w/c. Pt completed 48f of w/c propulsion, requiring intermittent rest breaks. Pt completed obstacle negotiation outdoors, as well as propelling over uneven surfaces with vc for safety and technique. Discussion with pt re d/c planning and AE/AD for the home. Pt then propelled back into hospital, safely navigating elevator with vc for technique. Pt transferred to therapy mat with set up and performed unsupported EOB sitting while hitting ball with weighted bar for 50x in order to increase dynamic sitting balance necessary for performing ADLs EOB. Pt then performed 2 sets of 20 core exercises to facilitate increased postural control to reduce fall risk overall. Pt returned to room with all needs met and family present.   Therapy Documentation Precautions:  Precautions Precautions: Fall Required Braces or Orthoses: Other Brace/Splint Other Brace/Splint: External Fixator for 6-8 weeks on RLE Restrictions Weight Bearing Restrictions: Yes RLE Weight Bearing: Non weight bearing LLE Weight Bearing: Weight bearing as tolerated Other Position/Activity Restrictions: L LE WBAT for transfers only   Vital Signs: Therapy Vitals Temp: 98.7 F (37.1 C) Temp Source: Oral Pulse Rate: 69 Resp: 18 BP: (!) 122/54 Patient Position (if appropriate): Lying Oxygen Therapy SpO2: 99 % O2 Device: Room Air Pain: Pain Assessment Pain Scale: 0-10 Pain Score: 0-No pain  See Function Navigator for Current Functional  Status.   Therapy/Group: Individual Therapy  SCurtis Sites4/07/2018, 4:12 PM

## 2018-03-05 ENCOUNTER — Inpatient Hospital Stay (HOSPITAL_COMMUNITY): Payer: BC Managed Care – PPO | Admitting: Occupational Therapy

## 2018-03-05 ENCOUNTER — Inpatient Hospital Stay (HOSPITAL_COMMUNITY): Payer: BC Managed Care – PPO | Admitting: Physical Therapy

## 2018-03-05 DIAGNOSIS — G8918 Other acute postprocedural pain: Secondary | ICD-10-CM

## 2018-03-05 LAB — CREATININE, SERUM
Creatinine, Ser: 1.12 mg/dL — ABNORMAL HIGH (ref 0.44–1.00)
GFR, EST NON AFRICAN AMERICAN: 56 mL/min — AB (ref >60)

## 2018-03-05 MED ORDER — OXYCODONE HCL 5 MG PO TABS
5.0000 mg | ORAL_TABLET | Freq: Four times a day (QID) | ORAL | Status: DC | PRN
  Administered 2018-03-05 – 2018-03-07 (×5): 5 mg via ORAL
  Filled 2018-03-05 (×6): qty 1

## 2018-03-05 MED ORDER — TRAMADOL HCL 50 MG PO TABS
50.0000 mg | ORAL_TABLET | Freq: Four times a day (QID) | ORAL | Status: DC | PRN
  Administered 2018-03-05 – 2018-03-07 (×2): 50 mg via ORAL
  Filled 2018-03-05 (×2): qty 1

## 2018-03-05 NOTE — Progress Notes (Signed)
Physical Therapy Session Note  Patient Details  Name: Lauren [name] MRN: [id number] Date of Birth: 08/27/1966  Today's Date: 03/05/2018 PT Individual Time: 0900-1000 PT Individual Time Calculation (min): 60 min   Short Term Goals: Week 1:  PT Short Term Goal 1 (Week 1): =LTG due to estimated LOS  Skilled Therapeutic Interventions/Progress Updates: Pt received in w/c with handoff from OT; c/o pain as below and agreeable to treatment. W/c propulsion modI to gym. Transfer squat pivot uphill to mat table at 3" elevation over w/c; pt initially attempted to place board for transfer but ultimately decided to squat pivot as board seemed unsafe. Supine heel slides 2x15 BLE; utilized sheet to allow pt to provide self-guided passive overstretch to LLE at end range knee flexion. Educated on performing within tolerance, not to excessively pull past limits. Straight led raise 2x15 BLE. Applied kinesiotape to L knee for edema control; educated pt on potential benefits and advised on watching for signs of irritation with adhesive. Seated ball hit with 7# weighted bar for UE/core strengthening and aerobic endurance. Returned to w/c squat pivot to w/c downhill transfer with S. Pt reports ramp to be completed today according to her pastor who is working on it. Remained seated in w/c at end of session, all needs in reach.      Therapy Documentation Precautions:  Precautions Precautions: Fall Required Braces or Orthoses: Other Brace/Splint Other Brace/Splint: External Fixator for 6-8 weeks on RLE Restrictions Weight Bearing Restrictions: Yes RLE Weight Bearing: Non weight bearing LLE Weight Bearing: Weight bearing as tolerated Other Position/Activity Restrictions: L LE WBAT for transfers only Pain: Pain Assessment Pain Scale: 0-10 Pain Score: 6  Pain Type: Acute pain Pain Location: Foot Pain Orientation: Right Pain Descriptors / Indicators: Aching Pain Intervention(s): Medication (See eMAR)   See  Function Navigator for Current Functional Status.   Therapy/Group: Individual Therapy  Vista Lawmanlizabeth J Tygielski 03/05/2018, 9:51 AM

## 2018-03-05 NOTE — Progress Notes (Signed)
Social Work Patient ID: Lauren [name], female   DOB: October 03, 1966, 52 y.o.   MRN: [id number]  Met with pt and sister to discuss team conference goals mod/i-supervision level and target discharge date 4/12. Sister has been here and learned pt's care, question if others need to come in. Discussed with pt she can self direct her own care and tell her siblings what she needs assistance with. Both feel ready for discharge Friday. Have ordered equipment and follow up. Work toward discharge Friday.

## 2018-03-05 NOTE — Progress Notes (Signed)
Occupational Therapy Session Note  Patient Details  Name: Lauren [name] MRN: [id number] Date of Birth: 11/16/1966  Today's Date: 03/05/2018 OT Individual Time: 0830-0900 OT Individual Time Calculation (min): 30 min    Short Term Goals: Week 1:  OT Short Term Goal 1 (Week 1): STGs equal to LTGs based on ELOS  Skilled Therapeutic Interventions/Progress Updates:    1:1 Focus on self care retraining at EOB with setup for supplies. Pt able to perform slide board with setup to and from the drop arm BSC. Pt with improved activity tolerance. Pt is mod I with use of AE for dressing.   Therapy Documentation Precautions:  Precautions Precautions: Fall Required Braces or Orthoses: Other Brace/Splint Other Brace/Splint: External Fixator for 6-8 weeks on RLE Restrictions Weight Bearing Restrictions: Yes RLE Weight Bearing: Non weight bearing LLE Weight Bearing: Weight bearing as tolerated Other Position/Activity Restrictions: L LE WBAT for transfers only Pain:  no c/o pain  See Function Navigator for Current Functional Status.   Therapy/Group: Individual Therapy  Roney MansSmith, Lauren [name] 03/05/2018, 12:04 PM
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
## 2018-03-05 NOTE — Progress Notes (Signed)
Occupational Therapy Session Note  Patient Details  Name: Lauren [name] MRN: [id number] Date of Birth: 02/16/1966  Today's Date: 03/05/2018 OT Individual Time: [id number] OT Individual Time Calculation (min): 54 min    Short Term Goals: Week 1:  OT Short Term Goal 1 (Week 1): STGs equal to LTGs based on ELOS  Skilled Therapeutic Interventions/Progress Updates:    Pt completed wheelchair mobility to and from the dayroom with modified independence.  She was able to perform 3 sets of 5 mins on the UE ergonometer.  She completed all sets with resistance on level 11 and RPMs maintained at 25-30.  Rest breaks of 2-3 mins between each set.  Had her roll back to the room at completion of UE strengthening and then complete sliding board transfer to drop arm commode from the wheelchair.  Min instructional cueing for position of the wheelchair with supervision to complete transfer.  Clothing management once sitting on the toilet with supervision as well, incorporating lateral leans.  Finished session with pt in bed and call button and phone in reach.    Therapy Documentation Precautions:  Precautions Precautions: Fall Required Braces or Orthoses: Other Brace/Splint Other Brace/Splint: External Fixator for 6-8 weeks on RLE Restrictions Weight Bearing Restrictions: Yes RLE Weight Bearing: Non weight bearing LLE Weight Bearing: Weight bearing as tolerated Other Position/Activity Restrictions: L LE WBAT for transfers only   Pain: Pain Assessment Pain Scale: Faces Faces Pain Scale: Hurts a little bit Pain Type: Acute pain Pain Location: Foot Pain Orientation: Right Pain Descriptors / Indicators: Aching Pain Onset: With Activity Pain Intervention(s): Repositioned ADL: See Function Navigator for Current Functional Status.   Therapy/Group: Individual Therapy  Aedan Geimer OTR/L 03/05/2018, 12:37 PM
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
## 2018-03-06 ENCOUNTER — Inpatient Hospital Stay (HOSPITAL_COMMUNITY): Payer: BC Managed Care – PPO | Admitting: Occupational Therapy

## 2018-03-06 ENCOUNTER — Inpatient Hospital Stay (HOSPITAL_COMMUNITY): Payer: BC Managed Care – PPO | Admitting: Physical Therapy

## 2018-03-06 LAB — COMPREHENSIVE METABOLIC PANEL
ALT: 17 U/L (ref 14–54)
AST: 16 U/L (ref 15–41)
Albumin: 2.8 g/dL — ABNORMAL LOW (ref 3.5–5.0)
Alkaline Phosphatase: 79 U/L (ref 38–126)
Anion gap: 9 (ref 5–15)
BILIRUBIN TOTAL: 0.6 mg/dL (ref 0.3–1.2)
BUN: 18 mg/dL (ref 6–20)
CO2: 23 mmol/L (ref 22–32)
CREATININE: 1.02 mg/dL — AB (ref 0.44–1.00)
Calcium: 9.1 mg/dL (ref 8.9–10.3)
Chloride: 107 mmol/L (ref 101–111)
GFR calc Af Amer: >60 mL/min (ref >60)
Glucose, Bld: 100 mg/dL — ABNORMAL HIGH (ref 65–99)
Potassium: 4 mmol/L (ref 3.5–5.1)
Sodium: 139 mmol/L (ref 135–145)
Total Protein: 6.4 g/dL — ABNORMAL LOW (ref 6.5–8.1)

## 2018-03-06 LAB — CBC WITH DIFFERENTIAL/PLATELET
BASOS ABS: 0 10*3/uL (ref 0.0–0.1)
Basophils Relative: 0 %
Eosinophils Absolute: 0.2 10*3/uL (ref 0.0–0.7)
Eosinophils Relative: 4 %
HEMATOCRIT: 27.1 % — AB (ref 36.0–46.0)
Hemoglobin: 8.9 g/dL — ABNORMAL LOW (ref 12.0–15.0)
LYMPHS ABS: 1.5 10*3/uL (ref 0.7–4.0)
LYMPHS PCT: 30 %
MCH: 27.4 pg (ref 26.0–34.0)
MCHC: 32.8 g/dL (ref 30.0–36.0)
MCV: 83.4 fL (ref 78.0–100.0)
MONO ABS: 0.5 10*3/uL (ref 0.1–1.0)
Monocytes Relative: 9 %
NEUTROS ABS: 2.8 10*3/uL (ref 1.7–7.7)
Neutrophils Relative %: 57 %
Platelets: 391 10*3/uL (ref 150–400)
RBC: 3.25 MIL/uL — AB (ref 3.87–5.11)
RDW: 15.6 % — AB (ref 11.5–15.5)
WBC: 5 10*3/uL (ref 4.0–10.5)

## 2018-03-06 MED ORDER — ENOXAPARIN (LOVENOX) PATIENT EDUCATION KIT
PACK | Freq: Once | Status: AC
  Administered 2018-03-06: 17:00:00
  Filled 2018-03-06: qty 1

## 2018-03-06 MED ORDER — GABAPENTIN 300 MG PO CAPS
300.0000 mg | ORAL_CAPSULE | Freq: Three times a day (TID) | ORAL | Status: DC
  Administered 2018-03-06 – 2018-03-07 (×5): 300 mg via ORAL
  Filled 2018-03-06 (×5): qty 1

## 2018-03-06 MED ORDER — ENOXAPARIN SODIUM 40 MG/0.4ML ~~LOC~~ SOLN: SUBCUTANEOUS | 0 refills | Status: DC

## 2018-03-06 NOTE — Plan of Care (Signed)
  Problem: Consults Goal: RH GENERAL PATIENT EDUCATION Description See Patient Education module for education specifics. Outcome: Progressing Goal: Skin Care Protocol Initiated - if Braden Score 18 or less Description If consults are not indicated, leave blank or document N/A Outcome: Progressing   Problem: RH BOWEL ELIMINATION Goal: RH STG MANAGE BOWEL WITH ASSISTANCE Description STG Manage Bowel with Assistance. Outcome: Progressing Flowsheets (Taken 03/06/2018 1253) STG: Pt will manage bowels with assistance: 6-Modified independent Goal: RH STG MANAGE BOWEL W/MEDICATION W/ASSISTANCE Description STG Manage Bowel with Medication with Assistance. Outcome: Progressing Flowsheets (Taken 03/06/2018 1253) STG: Pt will manage bowels with medication with assistance: 6-Modified independent   Problem: RH BLADDER ELIMINATION Goal: RH STG MANAGE BLADDER WITH ASSISTANCE Description STG Manage Bladder With Assistance Outcome: Progressing Flowsheets (Taken 03/06/2018 1253) STG: Pt will manage bladder with assistance: 6-Modified independent   Problem: RH SKIN INTEGRITY Goal: RH STG SKIN FREE OF INFECTION/BREAKDOWN Outcome: Progressing Goal: RH STG MAINTAIN SKIN INTEGRITY WITH ASSISTANCE Description STG Maintain Skin Integrity With Assistance. Outcome: Progressing Flowsheets (Taken 03/06/2018 1253) STG: Maintain skin integrity with assistance: 4-Minimal assistance Goal: RH STG ABLE TO PERFORM INCISION/WOUND CARE W/ASSISTANCE Description STG Able To Perform Incision/Wound Care With Assistance. Outcome: Progressing Flowsheets (Taken 03/06/2018 1253) STG: Pt will be able to perform incision/wound care with assistance: 4-Minimal assistance   Problem: RH SAFETY Goal: RH STG ADHERE TO SAFETY PRECAUTIONS W/ASSISTANCE/DEVICE Description STG Adhere to Safety Precautions With Assistance/Device. Outcome: Progressing Flowsheets (Taken 03/06/2018 1253) STG:Pt will adhere to safety precautions  with assistance/device: 4-Minimal assistance   Problem: RH PAIN MANAGEMENT Goal: RH STG PAIN MANAGED AT OR BELOW PT'S PAIN GOAL Outcome: Progressing   Problem: RH KNOWLEDGE DEFICIT GENERAL Goal: RH STG INCREASE KNOWLEDGE OF SELF CARE AFTER HOSPITALIZATION Outcome: Progressing
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
## 2018-03-06 NOTE — Progress Notes (Signed)
Occupational Therapy Discharge Summary  Patient Details  Name: Lauren [name] MRN: [id number] Date of Birth: 1966-02-17  Today's Date: 03/06/2018 OT Individual Time: 0932-6712 OT Individual Time Calculation (min): 48 min   Session Note:  Pt completed toileting with modified independence on the drop arm commode as well as toilet transfer at the same level with use of the sliding board.  She was able to complete several transfers from bed to the wheelchair as well without use of the sliding board, but needed min guard assist and mod demonstrational cueing for technique.  She was able to then complete wheelchair mobility down to the ADL apartment with modified independent.  Educated pt on safe completion of simple meal prep tasks from the wheelchair level.  Had pt simulate gathering items using the wheelchair as well as transporting them around the kitchen using the counter tops and flat surfaces.  Finished session with return to the room with pt up in the wheelchair with call button and phone in reach.    Patient has met 8 of 8 long term goals due to improved activity tolerance, improved balance and ability to compensate for deficits.  Patient to discharge at overall Modified Independent level.  Patient's care partner is independent to provide the necessary physical assistance at discharge.    Reasons goals not met: NA  Recommendation:  Patient will benefit from ongoing skilled OT services in home health setting to continue to advance functional skills in the area of BADL, iADL, Vocation and Reduce care partner burden.  Pt is making steady progress with OT at this time.  Recommend HHOT eval for progression of selfcare tasks to a modified independent level in familiar environment as well as continued OT once weightbearing has increased.   Equipment: tub bench, wide drop arm commode  Reasons for discharge: treatment goals met and discharge from hospital  Patient/family agrees with progress made and  goals achieved: Yes  OT Discharge Precautions/Restrictions  Precautions Precautions: Fall Required Braces or Orthoses: Other Brace/Splint Other Brace/Splint: External Fixator for 6-8 weeks on RLE Restrictions Weight Bearing Restrictions: Yes RLE Weight Bearing: Non weight bearing LLE Weight Bearing: Weight bearing as tolerated Other Position/Activity Restrictions: L LE WBAT for transfers only  Pain Pain Assessment Pain Scale: 0-10 Pain Score: 4  Pain Type: Surgical pain Pain Location: Foot Pain Orientation: Right Pain Descriptors / Indicators: Aching;Discomfort Pain Onset: With Activity Pain Intervention(s): Repositioned ADL  See Function Section of chart for details  Vision Baseline Vision/History: No visual deficits Patient Visual Report: No change from baseline Vision Assessment?: No apparent visual deficits Perception  Perception: Within Functional Limits Praxis Praxis: Intact Cognition Overall Cognitive Status: Within Functional Limits for tasks assessed Arousal/Alertness: Awake/alert Orientation Level: Oriented X4 Attention: Sustained;Focused Focused Attention: Appears intact Sustained Attention: Appears intact Selective Attention: Appears intact Memory: Appears intact Awareness: Appears intact Problem Solving: Appears intact Safety/Judgment: Appears intact Sensation Sensation Light Touch: Appears Intact Stereognosis: Appears Intact Hot/Cold: Appears Intact Proprioception: Appears Intact Additional Comments: numbness/tingling bottom of L foot, entire R foot Coordination Gross Motor Movements are Fluid and Coordinated: Yes Fine Motor Movements are Fluid and Coordinated: Yes Motor  Motor Motor: Within Functional Limits Motor - Discharge Observations: limited by pain Mobility    See function section of chart for details  Trunk/Postural Assessment  Cervical Assessment Cervical Assessment: Within Functional Limits Thoracic Assessment Thoracic  Assessment: Within Functional Limits Lumbar Assessment Lumbar Assessment: Within Functional Limits Postural Control Postural Control: Within Functional Limits  Balance Balance Balance Assessed: Yes Dynamic  Sitting Balance Dynamic Sitting - Balance Support: No upper extremity supported;Feet supported;During functional activity Dynamic Sitting - Level of Assistance: 6: Modified independent (Device/Increase time) Dynamic Sitting - Balance Activities: Lateral lean/weight shifting;Forward lean/weight shifting Sitting balance - Comments: lateral leans to don underwear/pants Extremity/Trunk Assessment RUE Assessment RUE Assessment: Exceptions to WFL(slightly weaker grasp of 52 lbs compared to 62 on the left.  She reports increased pain in the 4th and 5th DIP joints with flexion and weightbearing.) LUE Assessment LUE Assessment: Within Functional Limits   See Function Navigator for Current Functional Status.  Domonique Cothran OTR/L 03/06/2018, 4:58 PM

## 2018-03-06 NOTE — Progress Notes (Signed)
Occupational Therapy Session Note  Patient Details  Name: Lauren [name] MRN: [id number] Date of Birth: 09/19/1966  Today's Date: 03/06/2018 OT Individual Time: 1100-1159 OT Individual Time Calculation (min): 59 min    Short Term Goals: Week 1:  OT Short Term Goal 1 (Week 1): STGs equal to LTGs based on ELOS  Skilled Therapeutic Interventions/Progress Updates:    Pt completed bathing and dressing sitting EOB with modified independence.  She performed lateral leans side to side for washing peri area and for pulling pants over hips.  She transferred to the wheelchair via sliding board and modified independence as well.  Educated pt on therapy putty exercises for the right hand in the therapy gym.  She was able to complete gross grasp and release and tip to tip pinch.  Handout also provided.    Therapy Documentation Precautions:  Precautions Precautions: Fall Required Braces or Orthoses: Other Brace/Splint Other Brace/Splint: External Fixator for 6-8 weeks on RLE Restrictions Weight Bearing Restrictions: Yes RLE Weight Bearing: Non weight bearing LLE Weight Bearing: Weight bearing as tolerated Other Position/Activity Restrictions: L LE WBAT for transfers only   Pain: Pain Assessment Pain Scale: 0-10 Pain Score: 4  Pain Type: Surgical pain Pain Location: Foot Pain Orientation: Right Pain Descriptors / Indicators: Aching Pain Onset: On-going Patients Stated Pain Goal: 1 Pain Intervention(s): Emotional support;Repositioned Multiple Pain Sites: No ADL: See Function Section of Chart for details  See Function Navigator for Current Functional Status.   Therapy/Group: Individual Therapy  Doxie Augenstein OTR/L 03/06/2018, 12:50 PM
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
## 2018-03-06 NOTE — Discharge Summary (Signed)
Discharge summary job # [id number]

## 2018-03-06 NOTE — Progress Notes (Signed)
Social Work  Discharge Note  The overall goal for the admission was met for:   Discharge location: [place] TO ASSIST-24 HR  Length of Stay: Yes-9 DAYS  Discharge activity level: Yes-MOD/I [place]  Home/community participation: Yes  Services provided included: MD, RD, PT, OT, RN, CM, TR, Pharmacy, Neuropsych and SW  Financial Services: Private Insurance: [place]  Follow-up services arranged: Home Health: [place], DME: [place], WIDE DROP-ARM BEDSIDE COMMODE, [place] and Patient/Family has no preference for HH/DME agencies  Comments (or additional information):SISTER'S STAYING [place] AND READY FOR HER TO [place].  Patient/Family verbalized understanding of follow-up arrangements: Yes  Individual responsible for coordination of the follow-up plan: SELF & PAM-SISTER  Confirmed correct DME delivered: Elease Hashimoto 03/06/2018    Lauren [name], [place] Rhyme

## 2018-03-06 NOTE — Progress Notes (Signed)
Physical Therapy Discharge Summary  Patient Details  Name: Lauren [name] MRN: [id number] Date of Birth: 1966-04-17  Today's Date: 03/06/2018 PT Individual Time: 0800-0830 and 1000-1055 PT Individual Time Calculation (min): 30 min and 55 min (total 85 min)   Patient has met 8 of 8 long term goals due to improved activity tolerance, improved balance, improved postural control, increased strength, increased range of motion, decreased pain and ability to compensate for deficits.  Patient to discharge at a wheelchair level Supervision.   Patient's care partner is independent to provide the necessary physical assistance at discharge.  Reasons goals not met: All goals met  Recommendation:  Patient will benefit from ongoing skilled PT services in home health setting to continue to advance safe functional mobility, address ongoing impairments in strength, ROM, activity tolerance, and minimize fall risk.  Equipment: W/c  Reasons for discharge: treatment goals met and discharge from hospital  Patient/family agrees with progress made and goals achieved: Yes  PT Discharge Precautions/Restrictions Precautions Precautions: Fall Required Braces or Orthoses: Other Brace/Splint Other Brace/Splint: External Fixator for 6-8 weeks on RLE Restrictions Weight Bearing Restrictions: Yes RLE Weight Bearing: Non weight bearing LLE Weight Bearing: Weight bearing as tolerated Other Position/Activity Restrictions: L LE WBAT for transfers only Pain Pain Assessment Pain Scale: 0-10 Pain Score: 5  Pain Type: Acute pain Pain Location: Foot Pain Orientation: Right Pain Descriptors / Indicators: Aching Pain Onset: On-going Patients Stated Pain Goal: 1 Pain Intervention(s): Repositioned Vision/Perception  Perception Perception: Within Functional Limits Praxis Praxis: Intact  Cognition Overall Cognitive Status: Within Functional Limits for tasks assessed Arousal/Alertness: Awake/alert Orientation  Level: Oriented X4 Focused Attention: Appears intact Sustained Attention: Appears intact Selective Attention: Appears intact Memory: Appears intact Awareness: Appears intact Problem Solving: Appears intact Safety/Judgment: Appears intact Sensation Sensation Additional Comments: numbness/tingling bottom of L foot, entire R foot Coordination Gross Motor Movements are Fluid and Coordinated: Yes Fine Motor Movements are Fluid and Coordinated: Yes Heel Shin Test: NT d/t ROM restrictions LLE, ex-fix RLE Motor  Motor Motor: Within Functional Limits Motor - Discharge Observations: limited by pain  Mobility Bed Mobility Bed Mobility: Sit to Supine;Supine to Sit Supine to Sit: 6: Modified independent (Device/Increase time);HOB flat Sit to Supine: HOB flat;6: Modified independent (Device/Increase time) Transfers Transfers: Yes Lateral/Scoot Transfers: 6: Modified independent (Device/Increase time);With slide board;With armrests removed Locomotion  Ambulation Ambulation: No Gait Gait: No Stairs / Additional Locomotion Stairs: No Wheelchair Mobility Wheelchair Mobility: Yes Wheelchair Assistance: 6: Modified independent (Device/Increase time) Environmental health practitioner: Both upper extremities Wheelchair Parts Management: Independent Distance: >400'  Trunk/Postural Assessment  Cervical Assessment Cervical Assessment: Within Functional Limits Thoracic Assessment Thoracic Assessment: Within Functional Limits Lumbar Assessment Lumbar Assessment: Within Functional Limits Postural Control Postural Control: Within Functional Limits  Balance Balance Balance Assessed: Yes Static Sitting Balance Static Sitting - Balance Support: No upper extremity supported;Feet supported Static Sitting - Level of Assistance: 6: Modified independent (Device/Increase time) Dynamic Sitting Balance Dynamic Sitting - Balance Support: No upper extremity supported;Feet supported;During functional  activity Dynamic Sitting - Level of Assistance: 6: Modified independent (Device/Increase time) Extremity Assessment  RUE Assessment RUE Assessment: Within Functional Limits LUE Assessment LUE Assessment: Within Functional Limits RLE Assessment RLE Assessment: Exceptions to [facility] grossly WFL, pain at end range knee flexion, ankle ROM/strength NT d/t external fixator) LLE Assessment LLE Assessment: Exceptions to WFL(knee extension lacking 5 degrees, knee flexion 75 degrees, strength grossly 4-/5 limited by pain, 4+/5 at ankle)  Skilled Therapeutic Intervention: Tx 1: Pt received supine in bed, c/o pain  as above and agreeable to treatment. Bed mobility and transfer to w/c modI. W/c propulsion modI throughout session. Car transfer performed modI with transfer board. Transfer to flat bed in apartment with S to elevated bed height and squat pivot; pt reports bed at home more level with w/c as performed from hospital bed. Returned to room modI in w/c at end of session, all needs in reach.  Tx 2: Pt received seated in bed, c/o pain as below and agreeable to treatment. Reviewed HEP with minimal cueing from therapist; exercises included heel slides, straight leg raise, hip flexion marching, long arc quad, hip adduction/abduction with pillow and level 2 theraband, 3 sets 10 reps each. Educated pt on exercises progression at d/c based on WB precautions and tolerance. Provided handout on bumping w/c up/down stairs as pt reports family uncertain about status of ramp; family not present to practice and pt declines scheduling day-of-discharge PT session. Remained seated in bed at end of session, all needs in reach.   See Function Navigator for Current Functional Status.  Benjiman Core Tygielski 03/06/2018, 10:41 AM

## 2018-03-06 NOTE — Discharge Instructions (Signed)
Inpatient Rehab Discharge Instructions  [name] Discharge date and time: No discharge date for patient encounter.   Activities/Precautions/ Functional Status: Activity: Nonweightbearing right lower extremity.  Weightbearing as tolerated left lower extremity for transfers only Diet: regular diet Wound Care: keep wound clean and dry Functional status:  ___ No restrictions     ___ Walk up steps independently ___ 24/7 supervision/assistance   ___ Walk up steps with assistance ___ Intermittent supervision/assistance  ___ Bathe/dress independently ___ Walk with walker     _x__ Bathe/dress with assistance ___ Walk Independently    ___ Shower independently ___ Walk with assistance    ___ Shower with assistance ___ No alcohol     ___ Return to work/school ________  Special Instructions: No driving  Clean pin sites to external fixator daily as needed   COMMUNITY REFERRALS UPON DISCHARGE:    Home Health:   PT & RN  Agency:[facility]   Phone:[phone]   Date of last service:03/06/2018  Medical Equipment/Items Ordered:WHEELCHAIR, WIDE DROP-ARM BEDSIDE COMMODE, TUB BENCH & 30 TRANSFER BOARD  Agency/Supplier:[facility] [phone]    My questions have been answered and I understand these instructions. I will adhere to these goals and the provided educational materials after my discharge from the hospital.  Patient/Caregiver Signature _______________________________ Date __________  Clinician Signature _______________________________________ Date __________  Please bring this form and your medication list with you to all your follow-up doctor's appointments.

## 2018-03-07 DIAGNOSIS — M62838 Other muscle spasm: Secondary | ICD-10-CM

## 2018-03-07 MED ORDER — GABAPENTIN 300 MG PO CAPS: 300.0000 mg | ORAL_CAPSULE | Freq: Three times a day (TID) | ORAL | 0 refills | Status: DC

## 2018-03-07 MED ORDER — METHOCARBAMOL 500 MG PO TABS
1000.0000 mg | ORAL_TABLET | Freq: Three times a day (TID) | ORAL | Status: DC
  Administered 2018-03-07: 1000 mg via ORAL
  Filled 2018-03-07: qty 2

## 2018-03-07 MED ORDER — ENOXAPARIN SODIUM 40 MG/0.4ML ~~LOC~~ SOLN: SUBCUTANEOUS | 0 refills | Status: DC

## 2018-03-07 MED ORDER — POLYETHYLENE GLYCOL 3350 17 G PO PACK: 17.0000 g | PACK | Freq: Two times a day (BID) | ORAL | 0 refills | Status: DC

## 2018-03-07 MED ORDER — ACETAMINOPHEN 325 MG PO TABS: 325.0000 mg | ORAL_TABLET | ORAL | Status: DC | PRN

## 2018-03-07 MED ORDER — OXYCODONE HCL 5 MG PO TABS: 5.0000 mg | ORAL_TABLET | Freq: Four times a day (QID) | ORAL | 0 refills | Status: DC | PRN

## 2018-03-07 MED ORDER — PANTOPRAZOLE SODIUM 40 MG PO TBEC: 40.0000 mg | DELAYED_RELEASE_TABLET | Freq: Every day | ORAL | 0 refills | Status: DC

## 2018-03-07 MED ORDER — METHOCARBAMOL 500 MG PO TABS: 1000.0000 mg | ORAL_TABLET | Freq: Three times a day (TID) | ORAL | 1 refills | Status: DC

## 2018-03-07 MED ORDER — DOCUSATE SODIUM 100 MG PO CAPS: 100.0000 mg | ORAL_CAPSULE | Freq: Two times a day (BID) | ORAL | 0 refills | Status: DC

## 2018-03-07 MED ORDER — TRAMADOL HCL 50 MG PO TABS: 50.0000 mg | ORAL_TABLET | Freq: Four times a day (QID) | ORAL | 0 refills | Status: DC | PRN

## 2018-03-07 NOTE — Progress Notes (Signed)
Patient and family trained to give lovenox injections, patient hesitant however family had no problem with administering. Patient discharged to home accompanied by family
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
## 2018-03-10 ENCOUNTER — Telehealth: Payer: Self-pay | Admitting: *Deleted

## 2018-03-10 ENCOUNTER — Telehealth: Payer: Self-pay | Admitting: Registered Nurse

## 2018-03-10 NOTE — Telephone Encounter (Signed)
Transitional Care call Transitional Care Call Completed, Appointment Confirmed, Address Confirmed, New Patient Packet Mailed  Patient name: Lauren [name]  DOB: 08-Dec-1965 1. Are you/is patient experiencing any problems since coming home? No a. Are there any questions regarding any aspect of care? No 2. Are there any questions regarding medications administration/dosing? No a. Are meds being taken as prescribed? Yes b. "Patient should review meds with caller to confirm"  Medication List Reviewed 3. Have there been any falls? No 4. Has Home Health been to the house and/or have they contacted you? Yes, [facility] a. If not, have you tried to contact them? NA b. Can we help you contact them? NA 5. Are bowels and bladder emptying properly? Yes a. Are there any unexpected incontinence issues? NA b. If applicable, is patient following bowel/bladder programs? NA 6. Any fevers, problems with breathing, unexpected pain? No 7. Are there any skin problems or new areas of breakdown? No 8. Has the patient/family member arranged specialty MD follow up (ie cardiology/neurology/renal/surgical/etc.)?  All appointments have been scheduled.  a. Can we help arrange? NA 9. Does the patient need any other services or support that we can help arrange? No 10. Are caregivers following through as expected in assisting the patient? Yes 11. Has the patient quit smoking, drinking alcohol, or using drugs as recommended? Lauren [name] denies smoking, drinking alcohol, or using illicit drugs  Appointment date/time 03/12/2018 arrival time 11:00 for 11:20 appointment with Dr. Allena KatzPatel, at [address] [phone]

## 2018-03-10 NOTE — Telephone Encounter (Signed)
[facility] RN 2wk1, 1wk3 for med management teaching and incision healing.  According to Larene Pickettebecca Dupree MSW [facility]  I have left a VM for Becky to review and let me know the status of the Home Care company.

## 2018-03-10 NOTE — Telephone Encounter (Signed)
Becky called back and [facility] supplied DME and [facility] is supplying HHRN and HHPT.  Orders will be ok'd with Tiffany RN.

## 2018-03-12 ENCOUNTER — Encounter: Payer: Self-pay | Admitting: Physical Medicine & Rehabilitation

## 2018-03-12 ENCOUNTER — Encounter
Payer: BC Managed Care – PPO | Attending: Physical Medicine & Rehabilitation | Admitting: Physical Medicine & Rehabilitation

## 2018-03-12 VITALS — BP 127/82 | HR 64 | Resp 14

## 2018-03-12 DIAGNOSIS — S61216D Laceration without foreign body of right little finger without damage to nail, subsequent encounter: Secondary | ICD-10-CM | POA: Insufficient documentation

## 2018-03-12 DIAGNOSIS — R5381 Other malaise: Secondary | ICD-10-CM | POA: Diagnosis not present

## 2018-03-12 DIAGNOSIS — K589 Irritable bowel syndrome without diarrhea: Secondary | ICD-10-CM | POA: Insufficient documentation

## 2018-03-12 DIAGNOSIS — M62838 Other muscle spasm: Secondary | ICD-10-CM

## 2018-03-12 DIAGNOSIS — S9304XD Dislocation of right ankle joint, subsequent encounter: Secondary | ICD-10-CM | POA: Diagnosis not present

## 2018-03-12 DIAGNOSIS — T07XXXA Unspecified multiple injuries, initial encounter: Secondary | ICD-10-CM | POA: Diagnosis not present

## 2018-03-12 DIAGNOSIS — S82002E Unspecified fracture of left patella, subsequent encounter for open fracture type I or II with routine healing: Secondary | ICD-10-CM | POA: Insufficient documentation

## 2018-03-12 DIAGNOSIS — R269 Unspecified abnormalities of gait and mobility: Secondary | ICD-10-CM

## 2018-03-12 DIAGNOSIS — G8918 Other acute postprocedural pain: Secondary | ICD-10-CM | POA: Diagnosis not present

## 2018-03-12 DIAGNOSIS — S82401E Unspecified fracture of shaft of right fibula, subsequent encounter for open fracture type I or II with routine healing: Secondary | ICD-10-CM | POA: Diagnosis not present

## 2018-03-12 DIAGNOSIS — Z7901 Long term (current) use of anticoagulants: Secondary | ICD-10-CM | POA: Diagnosis not present

## 2018-03-12 DIAGNOSIS — S61212D Laceration without foreign body of right middle finger without damage to nail, subsequent encounter: Secondary | ICD-10-CM | POA: Diagnosis not present

## 2018-03-12 DIAGNOSIS — F419 Anxiety disorder, unspecified: Secondary | ICD-10-CM | POA: Insufficient documentation
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
## 2018-03-18 ENCOUNTER — Telehealth: Payer: Self-pay | Admitting: *Deleted

## 2018-03-18 NOTE — Telephone Encounter (Signed)
Lauren [name], [facility] called about patient's Rx for Lovenox.  They want to know if this should be refilled or stopped if so when.  It states in hospital discharge note that it was prescribed for DVT prophylaxis.  Please advise ASAP so we can direct PCP.

## 2018-03-18 NOTE — Telephone Encounter (Signed)
She should refer to Ortho, who recommended 4 weeks to us, but they may want to adjust based on timing of Surgery.  Thanks.

## 2018-03-18 NOTE — Telephone Encounter (Signed)
Contacted Renea Eevelyn and conveyed Dr. Jill AlexandersPatels message

## 2018-03-28 ENCOUNTER — Encounter (HOSPITAL_COMMUNITY): Payer: Self-pay | Admitting: *Deleted

## 2018-03-28 ENCOUNTER — Other Ambulatory Visit: Payer: Self-pay

## 2018-03-31 ENCOUNTER — Encounter (HOSPITAL_COMMUNITY): Payer: Self-pay | Admitting: Anesthesiology

## 2018-03-31 NOTE — Anesthesia Preprocedure Evaluation (Addendum)
Anesthesia Evaluation  Patient identified by MRN, date of birth, ID band Patient awake    Reviewed: Allergy & Precautions, NPO status , Patient's Chart, lab work & pertinent test results  Airway Mallampati: I  TM Distance: >3 FB Neck ROM: Full    Dental  (+) Teeth Intact, Dental Advisory Given   Pulmonary pneumonia, resolved,    breath sounds clear to auscultation       Cardiovascular negative cardio ROS   Rhythm:Regular Rate:Normal     Neuro/Psych Anxiety negative neurological ROS     GI/Hepatic Neg liver ROS, GERD  Medicated and Controlled,  Endo/Other  negative endocrine ROS  Renal/GU Renal disease  negative genitourinary   Musculoskeletal  (+) Arthritis ,   Abdominal (+) + obese,   Peds  Hematology negative hematology ROS (+)   Anesthesia Other Findings   Reproductive/Obstetrics                            Anesthesia Physical Anesthesia Plan  ASA: II  Anesthesia Plan: General   Post-op Pain Management:    Induction: Intravenous  PONV Risk Score and Plan: 4 or greater and Ondansetron, Dexamethasone, Midazolam and Scopolamine patch - Pre-op  Airway Management Planned: Oral ETT  Additional Equipment: None  Intra-op Plan:   Post-operative Plan: Extubation in OR  Informed Consent: I have reviewed the patients History and Physical, chart, labs and discussed the procedure including the risks, benefits and alternatives for the proposed anesthesia with the patient or authorized representative who has indicated his/her understanding and acceptance.   Dental advisory given  Plan Discussed with: CRNA  Anesthesia Plan Comments:        Anesthesia Quick Evaluation

## 2018-04-01 ENCOUNTER — Ambulatory Visit (HOSPITAL_COMMUNITY): Payer: BC Managed Care – PPO | Admitting: Anesthesiology

## 2018-04-01 ENCOUNTER — Ambulatory Visit (HOSPITAL_COMMUNITY): Payer: BC Managed Care – PPO

## 2018-04-01 ENCOUNTER — Encounter (HOSPITAL_COMMUNITY)
Admission: RE | Disposition: A | Discharge status: Home / Self Care | Payer: Self-pay | Source: Ambulatory Visit | Attending: Orthopedic Surgery

## 2018-04-01 ENCOUNTER — Other Ambulatory Visit: Payer: Self-pay

## 2018-04-01 ENCOUNTER — Ambulatory Visit (HOSPITAL_COMMUNITY)
Admission: RE | Admit: 2018-04-01 | Discharge: 2018-04-01 | Disposition: A | Discharge status: Home / Self Care | Payer: BC Managed Care – PPO | Source: Ambulatory Visit | Attending: Orthopedic Surgery | Admitting: Orthopedic Surgery

## 2018-04-01 ENCOUNTER — Encounter (HOSPITAL_COMMUNITY): Payer: Self-pay | Admitting: *Deleted

## 2018-04-01 DIAGNOSIS — Z79899 Other long term (current) drug therapy: Secondary | ICD-10-CM | POA: Diagnosis not present

## 2018-04-01 DIAGNOSIS — E669 Obesity, unspecified: Secondary | ICD-10-CM | POA: Insufficient documentation

## 2018-04-01 DIAGNOSIS — F419 Anxiety disorder, unspecified: Secondary | ICD-10-CM | POA: Diagnosis not present

## 2018-04-01 DIAGNOSIS — Z6833 Body mass index (BMI) 33.0-33.9, adult: Secondary | ICD-10-CM | POA: Diagnosis not present

## 2018-04-01 DIAGNOSIS — K219 Gastro-esophageal reflux disease without esophagitis: Secondary | ICD-10-CM | POA: Insufficient documentation

## 2018-04-01 DIAGNOSIS — Y838 Other surgical procedures as the cause of abnormal reaction of the patient, or of later complication, without mention of misadventure at the time of the procedure: Secondary | ICD-10-CM | POA: Diagnosis not present

## 2018-04-01 DIAGNOSIS — T8484XA Pain due to internal orthopedic prosthetic devices, implants and grafts, initial encounter: Secondary | ICD-10-CM | POA: Diagnosis not present

## 2018-04-01 DIAGNOSIS — M1990 Unspecified osteoarthritis, unspecified site: Secondary | ICD-10-CM | POA: Diagnosis not present

## 2018-04-01 DIAGNOSIS — Z419 Encounter for procedure for purposes other than remedying health state, unspecified: Secondary | ICD-10-CM

## 2018-04-01 HISTORY — PX: EXTERNAL FIXATION REMOVAL: SHX5040

## 2018-04-01 HISTORY — DX: Gastro-esophageal reflux disease without esophagitis: K21.9

## 2018-04-01 HISTORY — DX: Pneumonia, unspecified organism: J18.9

## 2018-04-01 LAB — BASIC METABOLIC PANEL
ANION GAP: 6 (ref 5–15)
BUN: 10 mg/dL (ref 6–20)
CALCIUM: 9.1 mg/dL (ref 8.9–10.3)
CO2: 28 mmol/L (ref 22–32)
Chloride: 106 mmol/L (ref 101–111)
Creatinine, Ser: 1.06 mg/dL — ABNORMAL HIGH (ref 0.44–1.00)
GFR calc Af Amer: >60 mL/min (ref >60)
GLUCOSE: 103 mg/dL — AB (ref 65–99)
Potassium: 3.7 mmol/L (ref 3.5–5.1)
SODIUM: 140 mmol/L (ref 135–145)

## 2018-04-01 LAB — CBC
HCT: 35.9 % — ABNORMAL LOW (ref 36.0–46.0)
Hemoglobin: 12 g/dL (ref 12.0–15.0)
MCH: 26.8 pg (ref 26.0–34.0)
MCHC: 33.4 g/dL (ref 30.0–36.0)
MCV: 80.3 fL (ref 78.0–100.0)
PLATELETS: 362 10*3/uL (ref 150–400)
RBC: 4.47 MIL/uL (ref 3.87–5.11)
RDW: 14.4 % (ref 11.5–15.5)
WBC: 5.7 10*3/uL (ref 4.0–10.5)

## 2018-04-01 SURGERY — REMOVAL, EXTERNAL FIXATION DEVICE, LOWER EXTREMITY
Anesthesia: General | Site: Leg Lower | Laterality: Right

## 2018-04-01 MED ORDER — MIDAZOLAM HCL 2 MG/2ML IJ SOLN
INTRAMUSCULAR | Status: AC
  Filled 2018-04-01: qty 2

## 2018-04-01 MED ORDER — FENTANYL CITRATE (PF) 250 MCG/5ML IJ SOLN
INTRAMUSCULAR | Status: AC
  Filled 2018-04-01: qty 5

## 2018-04-01 MED ORDER — ONDANSETRON HCL 4 MG/2ML IJ SOLN
INTRAMUSCULAR | Status: AC
  Filled 2018-04-01: qty 2

## 2018-04-01 MED ORDER — PROMETHAZINE HCL 25 MG/ML IJ SOLN: 6.2500 mg | INTRAMUSCULAR | Status: DC | PRN

## 2018-04-01 MED ORDER — MEPERIDINE HCL 50 MG/ML IJ SOLN: 6.2500 mg | INTRAMUSCULAR | Status: DC | PRN

## 2018-04-01 MED ORDER — LACTATED RINGERS IV SOLN: INTRAVENOUS | Status: DC

## 2018-04-01 MED ORDER — LIDOCAINE 2% (20 MG/ML) 5 ML SYRINGE
INTRAMUSCULAR | Status: AC
  Filled 2018-04-01: qty 5

## 2018-04-01 MED ORDER — CEFAZOLIN SODIUM-DEXTROSE 2-4 GM/100ML-% IV SOLN
2.0000 g | INTRAVENOUS | Status: AC
  Administered 2018-04-01: 2 g via INTRAVENOUS
  Filled 2018-04-01: qty 100

## 2018-04-01 MED ORDER — SUGAMMADEX SODIUM 200 MG/2ML IV SOLN
INTRAVENOUS | Status: DC | PRN
  Administered 2018-04-01: 359.2 mg via INTRAVENOUS

## 2018-04-01 MED ORDER — ACETAMINOPHEN 10 MG/ML IV SOLN
INTRAVENOUS | Status: DC | PRN
  Administered 2018-04-01: 1000 mg via INTRAVENOUS

## 2018-04-01 MED ORDER — ONDANSETRON HCL 4 MG/2ML IJ SOLN
INTRAMUSCULAR | Status: DC | PRN
  Administered 2018-04-01: 4 mg via INTRAVENOUS

## 2018-04-01 MED ORDER — ROCURONIUM BROMIDE 10 MG/ML (PF) SYRINGE
PREFILLED_SYRINGE | INTRAVENOUS | Status: DC | PRN
  Administered 2018-04-01: 40 mg via INTRAVENOUS

## 2018-04-01 MED ORDER — DEXAMETHASONE SODIUM PHOSPHATE 10 MG/ML IJ SOLN
INTRAMUSCULAR | Status: AC
  Filled 2018-04-01: qty 1

## 2018-04-01 MED ORDER — ACETAMINOPHEN 10 MG/ML IV SOLN
INTRAVENOUS | Status: AC
  Filled 2018-04-01: qty 100

## 2018-04-01 MED ORDER — HYDROMORPHONE HCL 2 MG/ML IJ SOLN
0.2500 mg | INTRAMUSCULAR | Status: DC | PRN
  Administered 2018-04-01 (×4): 0.5 mg via INTRAVENOUS

## 2018-04-01 MED ORDER — LIDOCAINE 2% (20 MG/ML) 5 ML SYRINGE
INTRAMUSCULAR | Status: DC | PRN
  Administered 2018-04-01: 60 mg via INTRAVENOUS

## 2018-04-01 MED ORDER — SUGAMMADEX SODIUM 500 MG/5ML IV SOLN
INTRAVENOUS | Status: AC
  Filled 2018-04-01: qty 5

## 2018-04-01 MED ORDER — PROPOFOL 10 MG/ML IV BOLUS
INTRAVENOUS | Status: AC
  Filled 2018-04-01: qty 20

## 2018-04-01 MED ORDER — HYDROMORPHONE HCL 2 MG/ML IJ SOLN
INTRAMUSCULAR | Status: AC
  Administered 2018-04-01: 0.5 mg via INTRAVENOUS
  Filled 2018-04-01: qty 1

## 2018-04-01 MED ORDER — FENTANYL CITRATE (PF) 250 MCG/5ML IJ SOLN
INTRAMUSCULAR | Status: DC | PRN
  Administered 2018-04-01 (×3): 50 ug via INTRAVENOUS

## 2018-04-01 MED ORDER — DEXAMETHASONE SODIUM PHOSPHATE 10 MG/ML IJ SOLN
INTRAMUSCULAR | Status: DC | PRN
  Administered 2018-04-01: 10 mg via INTRAVENOUS

## 2018-04-01 MED ORDER — MIDAZOLAM HCL 5 MG/5ML IJ SOLN
INTRAMUSCULAR | Status: DC | PRN
  Administered 2018-04-01: 2 mg via INTRAVENOUS

## 2018-04-01 MED ORDER — 0.9 % SODIUM CHLORIDE (POUR BTL) OPTIME
TOPICAL | Status: DC | PRN
  Administered 2018-04-01: 1000 mL

## 2018-04-01 MED ORDER — PHENYLEPHRINE 40 MCG/ML (10ML) SYRINGE FOR IV PUSH (FOR BLOOD PRESSURE SUPPORT)
PREFILLED_SYRINGE | INTRAVENOUS | Status: AC
  Filled 2018-04-01: qty 10

## 2018-04-01 MED ORDER — CHLORHEXIDINE GLUCONATE 4 % EX LIQD: 60.0000 mL | Freq: Once | CUTANEOUS | Status: DC

## 2018-04-01 MED ORDER — PROPOFOL 10 MG/ML IV BOLUS
INTRAVENOUS | Status: DC | PRN
  Administered 2018-04-01: 120 mg via INTRAVENOUS

## 2018-04-01 MED ORDER — ROCURONIUM BROMIDE 50 MG/5ML IV SOLN
INTRAVENOUS | Status: AC
  Filled 2018-04-01: qty 1

## 2018-04-01 MED ORDER — FENTANYL CITRATE (PF) 100 MCG/2ML IJ SOLN
INTRAMUSCULAR | Status: AC
  Filled 2018-04-01: qty 2

## 2018-04-01 SURGICAL SUPPLY — 41 items
BANDAGE ACE 4X5 VEL STRL LF (GAUZE/BANDAGES/DRESSINGS) ×4 IMPLANT
BANDAGE ACE 6X5 VEL STRL LF (GAUZE/BANDAGES/DRESSINGS) ×4 IMPLANT
BANDAGE ELASTIC 4 VELCRO ST LF (GAUZE/BANDAGES/DRESSINGS) ×4 IMPLANT
BANDAGE ELASTIC 6 VELCRO ST LF (GAUZE/BANDAGES/DRESSINGS) ×4 IMPLANT
BNDG GAUZE ELAST 4 BULKY (GAUZE/BANDAGES/DRESSINGS) ×4 IMPLANT
BRUSH SCRUB SURG 4.25 DISP (MISCELLANEOUS) ×8 IMPLANT
COVER SURGICAL LIGHT HANDLE (MISCELLANEOUS) ×8 IMPLANT
DRAPE C-ARM 42X72 X-RAY (DRAPES) IMPLANT
DRAPE C-ARMOR (DRAPES) ×4 IMPLANT
DRAPE U-SHAPE 47X51 STRL (DRAPES) ×4 IMPLANT
DRSG ADAPTIC 3X8 NADH LF (GAUZE/BANDAGES/DRESSINGS) ×4 IMPLANT
DRSG PAD ABDOMINAL 8X10 ST (GAUZE/BANDAGES/DRESSINGS) ×8 IMPLANT
ELECT REM PT RETURN 9FT ADLT (ELECTROSURGICAL) ×4
ELECTRODE REM PT RTRN 9FT ADLT (ELECTROSURGICAL) ×2 IMPLANT
GAUZE SPONGE 4X4 12PLY STRL (GAUZE/BANDAGES/DRESSINGS) ×4 IMPLANT
GLOVE BIO SURGEON STRL SZ7.5 (GLOVE) ×4 IMPLANT
GLOVE BIO SURGEON STRL SZ8 (GLOVE) ×4 IMPLANT
GLOVE BIOGEL PI IND STRL 7.5 (GLOVE) ×2 IMPLANT
GLOVE BIOGEL PI IND STRL 8 (GLOVE) ×2 IMPLANT
GLOVE BIOGEL PI INDICATOR 7.5 (GLOVE) ×2
GLOVE BIOGEL PI INDICATOR 8 (GLOVE) ×2
GOWN STRL REUS W/ TWL LRG LVL3 (GOWN DISPOSABLE) ×4 IMPLANT
GOWN STRL REUS W/ TWL XL LVL3 (GOWN DISPOSABLE) ×2 IMPLANT
GOWN STRL REUS W/TWL LRG LVL3 (GOWN DISPOSABLE) ×4
GOWN STRL REUS W/TWL XL LVL3 (GOWN DISPOSABLE) ×2
KIT BASIN OR (CUSTOM PROCEDURE TRAY) ×4 IMPLANT
KIT TURNOVER KIT B (KITS) ×4 IMPLANT
MANIFOLD NEPTUNE II (INSTRUMENTS) ×4 IMPLANT
NS IRRIG 1000ML POUR BTL (IV SOLUTION) ×4 IMPLANT
PACK ORTHO EXTREMITY (CUSTOM PROCEDURE TRAY) ×4 IMPLANT
PAD ARMBOARD 7.5X6 YLW CONV (MISCELLANEOUS) ×8 IMPLANT
PADDING CAST COTTON 6X4 STRL (CAST SUPPLIES) ×12 IMPLANT
SPLINT PLASTER CAST XFAST 5X30 (CAST SUPPLIES) ×2 IMPLANT
SPLINT PLASTER XFAST SET 5X30 (CAST SUPPLIES) ×2
SPONGE LAP 18X18 X RAY DECT (DISPOSABLE) ×4 IMPLANT
STAPLER VISISTAT 35W (STAPLE) IMPLANT
SUT ETHILON 2 0 FS 18 (SUTURE) ×4 IMPLANT
TOWEL OR 17X24 6PK STRL BLUE (TOWEL DISPOSABLE) ×8 IMPLANT
TOWEL OR 17X26 10 PK STRL BLUE (TOWEL DISPOSABLE) ×8 IMPLANT
UNDERPAD 30X30 (UNDERPADS AND DIAPERS) ×4 IMPLANT
WATER STERILE IRR 1000ML POUR (IV SOLUTION) ×8 IMPLANT

## 2018-04-01 NOTE — Discharge Instructions (Addendum)
Nonweightbearing R leg Do not change dressing until follow up appointment. We will change dressing on 04/09/2018  You may continue to ice and elevate leg for swelling and pain control
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
## 2018-04-01 NOTE — H&P (Signed)
No interval change from discharge.  I discussed with the patient the risks and benefits of surgery for her right ankle, including the possibility of syndesmosis repair, infection, nerve injury, vessel injury, wound breakdown, arthritis, symptomatic hardware, DVT/ PE, loss of motion, malunion, nonunion, and need for further surgery among others.  She acknowledged these risks and wished to proceed.  Myrene Galas, MD [facility] [phone] (p)

## 2018-04-01 NOTE — Anesthesia Procedure Notes (Signed)
Procedure Name: Intubation Date/Time: 04/01/2018 8:23 AM Performed by: Adria Dill, CRNA Pre-anesthesia Checklist: Patient identified, Emergency Drugs available, Suction available and Patient being monitored Patient Re-evaluated:Patient Re-evaluated prior to induction Oxygen Delivery Method: Circle system utilized Preoxygenation: Pre-oxygenation with 100% oxygen Induction Type: IV induction Ventilation: Mask ventilation without difficulty Laryngoscope Size: Miller and 2 Grade View: Grade I Tube type: Oral Tube size: 7.0 mm Number of attempts: 1 Airway Equipment and Method: Stylet Placement Confirmation: ETT inserted through vocal cords under direct vision,  positive ETCO2,  CO2 detector and breath sounds checked- equal and bilateral Secured at: 20 cm Tube secured with: Tape Dental Injury: Teeth and Oropharynx as per pre-operative assessment

## 2018-04-01 NOTE — Transfer of Care (Signed)
Immediate Anesthesia Transfer of Care Note  Patient: [facility]  Procedure(s) Performed: REMOVAL EXTERNAL FIXATION RIGHT LEG (Right ) EXAM UNDER ANESTHESIA, RIGHT SYNDESMOSIS STRESS EVALUATION UNDER ANESTHESIA (Right Leg Lower)  Patient Location: PACU  Anesthesia Type:General  Level of Consciousness: awake, alert , oriented and patient cooperative  Airway & Oxygen Therapy: Patient Spontanous Breathing and Patient connected to nasal cannula oxygen  Post-op Assessment: Report given to RN and Post -op Vital signs reviewed and stable  Post vital signs: Reviewed and stable  Last Vitals:  Vitals Value Taken Time  BP    Temp    Pulse 65 04/01/2018  9:21 AM  Resp 13 04/01/2018  9:21 AM  SpO2 100 % 04/01/2018  9:21 AM  Vitals shown include unvalidated device data.  Last Pain:  Vitals:   04/01/18 0642  TempSrc:   PainSc: 4       Patients Stated Pain Goal: 4 (04/01/18 1610)  Complications: No apparent anesthesia complications

## 2018-04-01 NOTE — Progress Notes (Signed)
Orthopedic Tech Progress Note Patient Details:  Lauren [name] 29-Jan-1966 [id number]  Ortho Devices Type of Ortho Device: Crutches Ortho Device/Splint Interventions: Application   Post Interventions Patient Tolerated: Well Instructions Provided: Care of device Viewed order from doctor's order list  Nikki Dom 04/01/2018, 11:06 AM

## 2018-04-01 NOTE — Anesthesia Postprocedure Evaluation (Signed)
Anesthesia Post Note  Patient: Ivah Girardot  Procedure(s) Performed: REMOVAL EXTERNAL FIXATION RIGHT LEG (Right ) EXAM UNDER ANESTHESIA, RIGHT SYNDESMOSIS STRESS EVALUATION UNDER ANESTHESIA (Right Leg Lower)     Patient location during evaluation: PACU Anesthesia Type: General Level of consciousness: awake and alert Pain management: pain level controlled Vital Signs Assessment: post-procedure vital signs reviewed and stable Respiratory status: spontaneous breathing, nonlabored ventilation, respiratory function stable and patient connected to nasal cannula oxygen Cardiovascular status: blood pressure returned to baseline and stable Postop Assessment: no apparent nausea or vomiting Anesthetic complications: no    Last Vitals:  Vitals:   04/01/18 1022 04/01/18 1037  BP: 127/81 129/79  Pulse: 64 66  Resp: 14 15  Temp:  36.4 C  SpO2: 100% 93%    Last Pain:  Vitals:   04/01/18 1045  TempSrc:   PainSc: Asleep                 Shelton Silvas

## 2018-04-02 ENCOUNTER — Encounter (HOSPITAL_COMMUNITY): Payer: Self-pay | Admitting: Orthopedic Surgery

## 2018-07-03 ENCOUNTER — Other Ambulatory Visit: Payer: Self-pay | Admitting: Orthopedic Surgery

## 2018-07-03 DIAGNOSIS — S8261XM Displaced fracture of lateral malleolus of right fibula, subsequent encounter for open fracture type I or II with nonunion: Secondary | ICD-10-CM
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
## 2018-07-11 ENCOUNTER — Ambulatory Visit
Admission: RE | Admit: 2018-07-11 | Discharge: 2018-07-11 | Disposition: A | Discharge status: Home / Self Care | Payer: BC Managed Care – PPO | Source: Ambulatory Visit | Attending: Orthopedic Surgery | Admitting: Orthopedic Surgery

## 2018-07-11 DIAGNOSIS — S8261XM Displaced fracture of lateral malleolus of right fibula, subsequent encounter for open fracture type I or II with nonunion: Secondary | ICD-10-CM

## 2018-08-14 NOTE — Anesthesia Preprocedure Evaluation (Addendum)
Anesthesia Evaluation  Patient identified by MRN, date of birth, ID band Patient awake    Reviewed: Allergy & Precautions, H&P , NPO status , Patient's Chart, lab work & pertinent test results  Airway Mallampati: II  TM Distance: >3 FB Neck ROM: Full    Dental no notable dental hx. (+) Teeth Intact, Dental Advisory Given   Pulmonary neg pulmonary ROS,    Pulmonary exam normal breath sounds clear to auscultation       Cardiovascular Exercise Tolerance: Good negative cardio ROS   Rhythm:Regular Rate:Normal     Neuro/Psych Anxiety negative neurological ROS     GI/Hepatic Neg liver ROS, GERD  Medicated and Controlled,  Endo/Other  negative endocrine ROS  Renal/GU negative Renal ROS  negative genitourinary   Musculoskeletal  (+) Arthritis , Osteoarthritis,    Abdominal   Peds  Hematology negative hematology ROS (+) anemia ,   Anesthesia Other Findings   Reproductive/Obstetrics negative OB ROS                            Anesthesia Physical Anesthesia Plan  ASA: II  Anesthesia Plan: General   Post-op Pain Management:  Regional for Post-op pain   Induction: Intravenous  PONV Risk Score and Plan: 3 and Ondansetron, Dexamethasone and Midazolam  Airway Management Planned: LMA  Additional Equipment:   Intra-op Plan:   Post-operative Plan: Extubation in OR  Informed Consent: I have reviewed the patients History and Physical, chart, labs and discussed the procedure including the risks, benefits and alternatives for the proposed anesthesia with the patient or authorized representative who has indicated his/her understanding and acceptance.   Dental advisory given  Plan Discussed with: CRNA  Anesthesia Plan Comments:         Anesthesia Quick Evaluation

## 2018-08-14 NOTE — Progress Notes (Signed)
I was unable to reach patient by phone.  I left  A message on voice mail.  I [facility]:30 AM   , nothing to eat or drink after midnight.   I instructed the patient to take the following medications in the am with just enough water to get them down:Buspar, Clartin, use Flonase; Albuterol inhaler if needed and bring it with you  I asked patient to not wear any lotions, powders, cologne, jewelry, piercing, make-up or nail polish, deodorant..  I asked the patient to call [phone], in the am if there were any questions or problems.  I informed patient that she would need a driver and someone to stay with her the first 24 hours.

## 2018-08-15 ENCOUNTER — Ambulatory Visit (HOSPITAL_COMMUNITY): Payer: BC Managed Care – PPO | Admitting: Anesthesiology

## 2018-08-15 ENCOUNTER — Other Ambulatory Visit: Payer: Self-pay

## 2018-08-15 ENCOUNTER — Ambulatory Visit (HOSPITAL_COMMUNITY): Payer: BC Managed Care – PPO

## 2018-08-15 ENCOUNTER — Encounter (HOSPITAL_COMMUNITY): Payer: Self-pay

## 2018-08-15 ENCOUNTER — Encounter (HOSPITAL_COMMUNITY)
Admission: RE | Disposition: A | Discharge status: Home / Self Care | Payer: Self-pay | Source: Ambulatory Visit | Attending: Orthopedic Surgery

## 2018-08-15 ENCOUNTER — Ambulatory Visit (HOSPITAL_COMMUNITY)
Admission: RE | Admit: 2018-08-15 | Discharge: 2018-08-15 | Disposition: A | Discharge status: Home / Self Care | Payer: BC Managed Care – PPO | Source: Ambulatory Visit | Attending: Orthopedic Surgery | Admitting: Orthopedic Surgery

## 2018-08-15 DIAGNOSIS — T8484XA Pain due to internal orthopedic prosthetic devices, implants and grafts, initial encounter: Secondary | ICD-10-CM

## 2018-08-15 DIAGNOSIS — Z79899 Other long term (current) drug therapy: Secondary | ICD-10-CM | POA: Insufficient documentation

## 2018-08-15 DIAGNOSIS — S92121K Displaced fracture of body of right talus, subsequent encounter for fracture with nonunion: Secondary | ICD-10-CM | POA: Insufficient documentation

## 2018-08-15 DIAGNOSIS — K219 Gastro-esophageal reflux disease without esophagitis: Secondary | ICD-10-CM | POA: Diagnosis not present

## 2018-08-15 DIAGNOSIS — Z7951 Long term (current) use of inhaled steroids: Secondary | ICD-10-CM | POA: Diagnosis not present

## 2018-08-15 DIAGNOSIS — Z419 Encounter for procedure for purposes other than remedying health state, unspecified: Secondary | ICD-10-CM

## 2018-08-15 DIAGNOSIS — F419 Anxiety disorder, unspecified: Secondary | ICD-10-CM | POA: Insufficient documentation

## 2018-08-15 HISTORY — PX: HARDWARE REMOVAL: SHX979

## 2018-08-15 HISTORY — PX: BONE EXCISION: SHX6730

## 2018-08-15 LAB — SEDIMENTATION RATE: Sed Rate: 27 mm/hr — ABNORMAL HIGH (ref 0–22)

## 2018-08-15 LAB — CBC WITH DIFFERENTIAL/PLATELET
ABS IMMATURE GRANULOCYTES: 0 10*3/uL (ref 0.0–0.1)
Basophils Absolute: 0 10*3/uL (ref 0.0–0.1)
Basophils Relative: 0 %
EOS ABS: 0.1 10*3/uL (ref 0.0–0.7)
Eosinophils Relative: 1 %
HEMATOCRIT: 38.1 % (ref 36.0–46.0)
HEMOGLOBIN: 12.4 g/dL (ref 12.0–15.0)
IMMATURE GRANULOCYTES: 0 %
LYMPHS ABS: 1.4 10*3/uL (ref 0.7–4.0)
LYMPHS PCT: 25 %
MCH: 26.4 pg (ref 26.0–34.0)
MCHC: 32.5 g/dL (ref 30.0–36.0)
MCV: 81.1 fL (ref 78.0–100.0)
MONOS PCT: 11 %
Monocytes Absolute: 0.6 10*3/uL (ref 0.1–1.0)
NEUTROS PCT: 63 %
Neutro Abs: 3.5 10*3/uL (ref 1.7–7.7)
Platelets: 248 10*3/uL (ref 150–400)
RBC: 4.7 MIL/uL (ref 3.87–5.11)
RDW: 16.3 % — AB (ref 11.5–15.5)
WBC: 5.7 10*3/uL (ref 4.0–10.5)

## 2018-08-15 LAB — COMPREHENSIVE METABOLIC PANEL
ALBUMIN: 4 g/dL (ref 3.5–5.0)
ALK PHOS: 109 U/L (ref 38–126)
ALT: 17 U/L (ref 0–44)
ANION GAP: 13 (ref 5–15)
AST: 18 U/L (ref 15–41)
BILIRUBIN TOTAL: 0.8 mg/dL (ref 0.3–1.2)
BUN: 9 mg/dL (ref 6–20)
CO2: 23 mmol/L (ref 22–32)
Calcium: 9.3 mg/dL (ref 8.9–10.3)
Chloride: 108 mmol/L (ref 98–111)
Creatinine, Ser: 0.95 mg/dL (ref 0.44–1.00)
GFR calc Af Amer: >60 mL/min (ref >60)
Glucose, Bld: 101 mg/dL — ABNORMAL HIGH (ref 70–99)
POTASSIUM: 3.7 mmol/L (ref 3.5–5.1)
Sodium: 144 mmol/L (ref 135–145)
TOTAL PROTEIN: 7 g/dL (ref 6.5–8.1)

## 2018-08-15 LAB — PROTIME-INR
INR: 1.09
PROTHROMBIN TIME: 14 s (ref 11.4–15.2)

## 2018-08-15 LAB — C-REACTIVE PROTEIN: CRP: <0.8 mg/dL (ref <1.0)

## 2018-08-15 SURGERY — BONE EXCISION
Anesthesia: Regional | Site: Ankle | Laterality: Right

## 2018-08-15 MED ORDER — LIDOCAINE 2% (20 MG/ML) 5 ML SYRINGE
INTRAMUSCULAR | Status: AC
  Filled 2018-08-15: qty 5

## 2018-08-15 MED ORDER — KETOROLAC TROMETHAMINE 15 MG/ML IJ SOLN
INTRAMUSCULAR | Status: AC
  Filled 2018-08-15: qty 1

## 2018-08-15 MED ORDER — FENTANYL CITRATE (PF) 250 MCG/5ML IJ SOLN
INTRAMUSCULAR | Status: AC
  Filled 2018-08-15: qty 5

## 2018-08-15 MED ORDER — KETOROLAC TROMETHAMINE 10 MG PO TABS: 10.0000 mg | ORAL_TABLET | Freq: Four times a day (QID) | ORAL | 0 refills | Status: AC | PRN

## 2018-08-15 MED ORDER — ONDANSETRON HCL 4 MG/2ML IJ SOLN
INTRAMUSCULAR | Status: AC
  Filled 2018-08-15: qty 2

## 2018-08-15 MED ORDER — GLYCOPYRROLATE PF 0.2 MG/ML IJ SOSY
PREFILLED_SYRINGE | INTRAMUSCULAR | Status: DC | PRN
  Administered 2018-08-15: .1 mg via INTRAVENOUS

## 2018-08-15 MED ORDER — ROCURONIUM BROMIDE 50 MG/5ML IV SOSY
PREFILLED_SYRINGE | INTRAVENOUS | Status: DC | PRN
  Administered 2018-08-15: 50 mg via INTRAVENOUS

## 2018-08-15 MED ORDER — MIDAZOLAM HCL 2 MG/2ML IJ SOLN
INTRAMUSCULAR | Status: AC
  Administered 2018-08-15: 2 mg via INTRAVENOUS
  Filled 2018-08-15: qty 2

## 2018-08-15 MED ORDER — MIDAZOLAM HCL 2 MG/2ML IJ SOLN
2.0000 mg | Freq: Once | INTRAMUSCULAR | Status: AC
  Administered 2018-08-15: 2 mg via INTRAVENOUS

## 2018-08-15 MED ORDER — 0.9 % SODIUM CHLORIDE (POUR BTL) OPTIME
TOPICAL | Status: DC | PRN
  Administered 2018-08-15: 1000 mL

## 2018-08-15 MED ORDER — PROPOFOL 10 MG/ML IV BOLUS
INTRAVENOUS | Status: DC | PRN
  Administered 2018-08-15: 130 mg via INTRAVENOUS

## 2018-08-15 MED ORDER — FENTANYL CITRATE (PF) 100 MCG/2ML IJ SOLN
INTRAMUSCULAR | Status: DC | PRN
  Administered 2018-08-15: 50 ug via INTRAVENOUS

## 2018-08-15 MED ORDER — BUPIVACAINE-EPINEPHRINE (PF) 0.5% -1:200000 IJ SOLN
INTRAMUSCULAR | Status: DC | PRN
  Administered 2018-08-15: 30 mL via PERINEURAL

## 2018-08-15 MED ORDER — HYDROMORPHONE HCL 1 MG/ML IJ SOLN
0.2500 mg | INTRAMUSCULAR | Status: DC | PRN
  Administered 2018-08-15: 0.5 mg via INTRAVENOUS

## 2018-08-15 MED ORDER — GLYCOPYRROLATE PF 0.2 MG/ML IJ SOSY
PREFILLED_SYRINGE | INTRAMUSCULAR | Status: AC
  Filled 2018-08-15: qty 1

## 2018-08-15 MED ORDER — BUPIVACAINE HCL (PF) 0.5 % IJ SOLN
INTRAMUSCULAR | Status: DC | PRN
  Administered 2018-08-15: 10 mL

## 2018-08-15 MED ORDER — CHLORHEXIDINE GLUCONATE 4 % EX LIQD: 60.0000 mL | Freq: Once | CUTANEOUS | Status: DC

## 2018-08-15 MED ORDER — KETOROLAC TROMETHAMINE 15 MG/ML IJ SOLN
15.0000 mg | Freq: Once | INTRAMUSCULAR | Status: AC
  Administered 2018-08-15: 15 mg via INTRAVENOUS

## 2018-08-15 MED ORDER — ROCURONIUM BROMIDE 50 MG/5ML IV SOSY
PREFILLED_SYRINGE | INTRAVENOUS | Status: AC
  Filled 2018-08-15: qty 5

## 2018-08-15 MED ORDER — PROPOFOL 10 MG/ML IV BOLUS
INTRAVENOUS | Status: AC
  Filled 2018-08-15: qty 20

## 2018-08-15 MED ORDER — ONDANSETRON 4 MG PO TBDP: 4.0000 mg | ORAL_TABLET | Freq: Three times a day (TID) | ORAL | 0 refills | Status: AC | PRN

## 2018-08-15 MED ORDER — CEFAZOLIN SODIUM-DEXTROSE 2-4 GM/100ML-% IV SOLN
2.0000 g | INTRAVENOUS | Status: AC
  Administered 2018-08-15: 2 g via INTRAVENOUS
  Filled 2018-08-15: qty 100

## 2018-08-15 MED ORDER — FENTANYL CITRATE (PF) 100 MCG/2ML IJ SOLN
100.0000 ug | Freq: Once | INTRAMUSCULAR | Status: AC
  Administered 2018-08-15: 100 ug via INTRAVENOUS

## 2018-08-15 MED ORDER — HYDROMORPHONE HCL 1 MG/ML IJ SOLN
INTRAMUSCULAR | Status: AC
  Filled 2018-08-15: qty 1

## 2018-08-15 MED ORDER — HYDROCODONE-ACETAMINOPHEN 5-325 MG PO TABS: 1.0000 | ORAL_TABLET | Freq: Three times a day (TID) | ORAL | 0 refills | Status: AC | PRN

## 2018-08-15 MED ORDER — LACTATED RINGERS IV SOLN
INTRAVENOUS | Status: DC | PRN
  Administered 2018-08-15: 07:00:00 via INTRAVENOUS

## 2018-08-15 MED ORDER — SUGAMMADEX SODIUM 200 MG/2ML IV SOLN
INTRAVENOUS | Status: DC | PRN
  Administered 2018-08-15: 200 mg via INTRAVENOUS

## 2018-08-15 MED ORDER — DEXAMETHASONE SODIUM PHOSPHATE 4 MG/ML IJ SOLN
INTRAMUSCULAR | Status: DC | PRN
  Administered 2018-08-15: 8 mg via INTRAVENOUS

## 2018-08-15 MED ORDER — MIDAZOLAM HCL 5 MG/5ML IJ SOLN
INTRAMUSCULAR | Status: DC | PRN
  Administered 2018-08-15: 2 mg via INTRAVENOUS

## 2018-08-15 MED ORDER — DEXAMETHASONE SODIUM PHOSPHATE 10 MG/ML IJ SOLN
INTRAMUSCULAR | Status: AC
  Filled 2018-08-15: qty 1

## 2018-08-15 MED ORDER — LACTATED RINGERS IV SOLN: INTRAVENOUS | Status: DC

## 2018-08-15 MED ORDER — ONDANSETRON HCL 4 MG/2ML IJ SOLN
INTRAMUSCULAR | Status: DC | PRN
  Administered 2018-08-15: 4 mg via INTRAVENOUS

## 2018-08-15 MED ORDER — MIDAZOLAM HCL 2 MG/2ML IJ SOLN
INTRAMUSCULAR | Status: AC
  Filled 2018-08-15: qty 2

## 2018-08-15 MED ORDER — LACTATED RINGERS IV SOLN
INTRAVENOUS | Status: DC
  Administered 2018-08-15: 07:00:00 via INTRAVENOUS

## 2018-08-15 MED ORDER — FENTANYL CITRATE (PF) 100 MCG/2ML IJ SOLN
INTRAMUSCULAR | Status: AC
  Administered 2018-08-15: 100 ug via INTRAVENOUS
  Filled 2018-08-15: qty 2

## 2018-08-15 SURGICAL SUPPLY — 62 items
BANDAGE ACE 4X5 VEL STRL LF (GAUZE/BANDAGES/DRESSINGS) ×3 IMPLANT
BANDAGE ACE 6X5 VEL STRL LF (GAUZE/BANDAGES/DRESSINGS) ×3 IMPLANT
BANDAGE ELASTIC 4 VELCRO ST LF (GAUZE/BANDAGES/DRESSINGS) ×3 IMPLANT
BANDAGE ELASTIC 6 VELCRO ST LF (GAUZE/BANDAGES/DRESSINGS) ×3 IMPLANT
BANDAGE ESMARK 6X9 LF (GAUZE/BANDAGES/DRESSINGS) ×1 IMPLANT
BNDG COHESIVE 6X5 TAN STRL LF (GAUZE/BANDAGES/DRESSINGS) ×3 IMPLANT
BNDG ESMARK 6X9 LF (GAUZE/BANDAGES/DRESSINGS) ×3
BNDG GAUZE ELAST 4 BULKY (GAUZE/BANDAGES/DRESSINGS) ×3 IMPLANT
BRUSH SCRUB SURG 4.25 DISP (MISCELLANEOUS) ×6 IMPLANT
CLOSURE WOUND 1/2 X4 (GAUZE/BANDAGES/DRESSINGS)
COVER SURGICAL LIGHT HANDLE (MISCELLANEOUS) ×6 IMPLANT
CUFF TOURNIQUET SINGLE 18IN (TOURNIQUET CUFF) IMPLANT
CUFF TOURNIQUET SINGLE 24IN (TOURNIQUET CUFF) IMPLANT
CUFF TOURNIQUET SINGLE 34IN LL (TOURNIQUET CUFF) IMPLANT
DRAPE C-ARM 42X72 X-RAY (DRAPES) IMPLANT
DRAPE C-ARMOR (DRAPES) ×3 IMPLANT
DRAPE U-SHAPE 47X51 STRL (DRAPES) ×3 IMPLANT
DRSG ADAPTIC 3X8 NADH LF (GAUZE/BANDAGES/DRESSINGS) ×3 IMPLANT
ELECT REM PT RETURN 9FT ADLT (ELECTROSURGICAL) ×3
ELECTRODE REM PT RTRN 9FT ADLT (ELECTROSURGICAL) ×1 IMPLANT
GAUZE SPONGE 4X4 12PLY STRL (GAUZE/BANDAGES/DRESSINGS) ×3 IMPLANT
GAUZE SPONGE 4X4 12PLY STRL LF (GAUZE/BANDAGES/DRESSINGS) ×3 IMPLANT
GLOVE BIO SURGEON STRL SZ7.5 (GLOVE) ×3 IMPLANT
GLOVE BIO SURGEON STRL SZ8 (GLOVE) ×3 IMPLANT
GLOVE BIOGEL PI IND STRL 7.5 (GLOVE) ×1 IMPLANT
GLOVE BIOGEL PI IND STRL 8 (GLOVE) ×1 IMPLANT
GLOVE BIOGEL PI INDICATOR 7.5 (GLOVE) ×2
GLOVE BIOGEL PI INDICATOR 8 (GLOVE) ×2
GOWN STRL REUS W/ TWL LRG LVL3 (GOWN DISPOSABLE) ×2 IMPLANT
GOWN STRL REUS W/ TWL XL LVL3 (GOWN DISPOSABLE) ×1 IMPLANT
GOWN STRL REUS W/TWL LRG LVL3 (GOWN DISPOSABLE) ×4
GOWN STRL REUS W/TWL XL LVL3 (GOWN DISPOSABLE) ×2
KIT BASIN OR (CUSTOM PROCEDURE TRAY) ×3 IMPLANT
KIT TURNOVER KIT B (KITS) ×3 IMPLANT
MANIFOLD NEPTUNE II (INSTRUMENTS) ×3 IMPLANT
NEEDLE 22X1 1/2 (OR ONLY) (NEEDLE) IMPLANT
NS IRRIG 1000ML POUR BTL (IV SOLUTION) ×3 IMPLANT
PACK ORTHO EXTREMITY (CUSTOM PROCEDURE TRAY) ×3 IMPLANT
PAD ARMBOARD 7.5X6 YLW CONV (MISCELLANEOUS) ×6 IMPLANT
PAD CAST 4YDX4 CTTN HI CHSV (CAST SUPPLIES) ×1 IMPLANT
PADDING CAST COTTON 4X4 STRL (CAST SUPPLIES) ×2
PADDING CAST COTTON 6X4 STRL (CAST SUPPLIES) ×9 IMPLANT
SPONGE LAP 18X18 X RAY DECT (DISPOSABLE) ×3 IMPLANT
STAPLER VISISTAT 35W (STAPLE) IMPLANT
STOCKINETTE IMPERVIOUS LG (DRAPES) ×3 IMPLANT
STRIP CLOSURE SKIN 1/2X4 (GAUZE/BANDAGES/DRESSINGS) IMPLANT
SUCTION FRAZIER HANDLE 10FR (MISCELLANEOUS)
SUCTION TUBE FRAZIER 10FR DISP (MISCELLANEOUS) IMPLANT
SUT ETHILON 3 0 PS 1 (SUTURE) IMPLANT
SUT PDS AB 2-0 CT1 27 (SUTURE) IMPLANT
SUT VIC AB 0 CT1 27 (SUTURE)
SUT VIC AB 0 CT1 27XBRD ANBCTR (SUTURE) IMPLANT
SUT VIC AB 2-0 CT1 27 (SUTURE)
SUT VIC AB 2-0 CT1 TAPERPNT 27 (SUTURE) IMPLANT
SYR CONTROL 10ML LL (SYRINGE) IMPLANT
TOWEL OR 17X24 6PK STRL BLUE (TOWEL DISPOSABLE) ×6 IMPLANT
TOWEL OR 17X26 10 PK STRL BLUE (TOWEL DISPOSABLE) ×6 IMPLANT
TUBE CONNECTING 12'X1/4 (SUCTIONS) ×1
TUBE CONNECTING 12X1/4 (SUCTIONS) ×2 IMPLANT
UNDERPAD 30X30 (UNDERPADS AND DIAPERS) ×3 IMPLANT
WATER STERILE IRR 1000ML POUR (IV SOLUTION) ×6 IMPLANT
YANKAUER SUCT BULB TIP NO VENT (SUCTIONS) ×3 IMPLANT

## 2018-08-15 NOTE — Anesthesia Procedure Notes (Signed)
Anesthesia Regional Block: Popliteal block   Pre-Anesthetic Checklist: ,, timeout performed, Correct Patient, Correct Site, Correct Laterality, Correct Procedure, Correct Position, site marked, Risks and benefits discussed, pre-op evaluation,  At surgeon's request and post-op pain management  Laterality: Right  Prep: Maximum Sterile Barrier Precautions used, chloraprep       Needles:  Injection technique: Single-shot  Needle Type: Echogenic Stimulator Needle     Needle Length: 9cm  Needle Gauge: 21     Additional Needles:   Procedures:, nerve stimulator,,, ultrasound used (permanent image in chart),,,,   Nerve Stimulator or Paresthesia:  Response: Peroneal, 0.4 mA,  Response: Tibial, 0.4 mA,   Additional Responses:   Narrative:  Start time: 08/15/2018 7:05 AM End time: 08/15/2018 7:15 AM Injection made incrementally with aspirations every 5 mL. Anesthesiologist: Gaynelle AduFitzgerald, Darral Rishel, MD  Additional Notes: 2% Lidocaine skin wheel. Saphenous block with 10cc of 0.5% Bupivicaine plain.

## 2018-08-15 NOTE — Anesthesia Postprocedure Evaluation (Signed)
Anesthesia Post Note  Patient: Lauren [name]  Procedure(s) Performed: BONE EXCISION TALUS (Right Ankle) HARDWARE REMOVAL RIGHT ANKLE (Right Ankle)     Patient location during evaluation: PACU Anesthesia Type: Regional and General Level of consciousness: awake and alert Pain management: pain level controlled Vital Signs Assessment: post-procedure vital signs reviewed and stable Respiratory status: spontaneous breathing, nonlabored ventilation and respiratory function stable Cardiovascular status: blood pressure returned to baseline and stable Postop Assessment: no apparent nausea or vomiting Anesthetic complications: no    Last Vitals:  Vitals:   08/15/18 1045 08/15/18 1046  BP:  (!) 127/92  Pulse: 67   Resp: 20   Temp:    SpO2: 100% 100%    Last Pain:  Vitals:   08/15/18 1035  TempSrc:   PainSc: 5                  Lovene Maret,W. EDMOND

## 2018-08-15 NOTE — Discharge Instructions (Addendum)
[facility] Discharge Instructions   General Discharge Instructions  WEIGHT BEARING STATUS: Weight-bear as tolerated right leg  RANGE OF MOTION/ACTIVITY: Unrestricted range of motion right ankle  Wound Care: Daily wound care starting on 08/17/2018, see below  Discharge Wound Care Instructions  Do NOT apply any ointments, solutions or lotions to pin sites or surgical wounds.  These prevent needed drainage and even though solutions like hydrogen peroxide kill bacteria, they also damage cells lining the pin sites that help fight infection.  Applying lotions or ointments can keep the wounds moist and can cause them to breakdown and open up as well. This can increase the risk for infection. When in doubt call the office.  Surgical incisions should be dressed daily.  If any drainage is noted, use one layer of adaptic, then gauze, Kerlix, and an ace wrap.  Once the incision is completely dry and without drainage, it may be left open to air out.  Showering may begin 36-48 hours later.  Cleaning gently with soap and water.  Traumatic wounds should be dressed daily as well.    One layer of adaptic, gauze, Kerlix, then ace wrap.  The adaptic can be discontinued once the draining has ceased    If you have a wet to dry dressing: wet the gauze with saline the squeeze as much saline out so the gauze is moist (not soaking wet), place moistened gauze over wound, then place a dry gauze over the moist one, followed by Kerlix wrap, then ace wrap.    Diet: as you were eating previously.  Can use over the counter stool softeners and bowel preparations, such as Miralax, to help with bowel movements.  Narcotics can be constipating.  Be sure to drink plenty of fluids  PAIN MEDICATION USE AND EXPECTATIONS  You have likely been given narcotic medications to help control your pain.  After a traumatic event that results in an fracture (broken bone) with or without surgery, it is ok to use narcotic  pain medications to help control one's pain.  We understand that everyone responds to pain differently and each individual patient will be evaluated on a regular basis for the continued need for narcotic medications. Ideally, narcotic medication use should last no more than 6-8 weeks (coinciding with fracture healing).   As a patient it is your responsibility as well to monitor narcotic medication use and report the amount and frequency you use these medications when you come to your office visit.   We would also advise that if you are using narcotic medications, you should take a dose prior to therapy to maximize you participation.  IF YOU ARE ON NARCOTIC MEDICATIONS IT IS NOT PERMISSIBLE TO OPERATE A MOTOR VEHICLE (MOTORCYCLE/CAR/TRUCK/MOPED) OR HEAVY MACHINERY DO NOT MIX NARCOTICS WITH OTHER CNS (CENTRAL NERVOUS SYSTEM) DEPRESSANTS SUCH AS ALCOHOL   STOP SMOKING OR USING NICOTINE PRODUCTS!!!!  As discussed nicotine severely impairs your body's ability to heal surgical and traumatic wounds but also impairs bone healing.  Wounds and bone heal by forming microscopic blood vessels (angiogenesis) and nicotine is a vasoconstrictor (essentially, shrinks blood vessels).  Therefore, if vasoconstriction occurs to these microscopic blood vessels they essentially disappear and are unable to deliver necessary nutrients to the healing tissue.  This is one modifiable factor that you can do to dramatically increase your chances of healing your injury.    (This means no smoking, no nicotine gum, patches, etc)  DO NOT USE NONSTEROIDAL ANTI-INFLAMMATORY DRUGS (NSAID'S)  Using products such as  Advil (ibuprofen), Aleve (naproxen), Motrin (ibuprofen) for additional pain control during fracture healing can delay and/or prevent the healing response.  If you would like to take over the counter (OTC) medication, Tylenol (acetaminophen) is ok.  However, some narcotic medications that are given for pain control contain  acetaminophen as well. Therefore, you should not exceed more than 4000 mg of tylenol in a day if you do not have liver disease.  Also note that there are may OTC medicines, such as cold medicines and allergy medicines that my contain tylenol as well.  If you have any questions about medications and/or interactions please ask your doctor/PA or your pharmacist.      ICE AND ELEVATE INJURED/OPERATIVE EXTREMITY  Using ice and elevating the injured extremity above your heart can help with swelling and pain control.  Icing in a pulsatile fashion, such as 20 minutes on and 20 minutes off, can be followed.    Do not place ice directly on skin. Make sure there is a barrier between to skin and the ice pack.    Using frozen items such as frozen peas works well as the conform nicely to the are that needs to be iced.  USE AN ACE WRAP OR TED HOSE FOR SWELLING CONTROL  In addition to icing and elevation, Ace wraps or TED hose are used to help limit and resolve swelling.  It is recommended to use Ace wraps or TED hose until you are informed to stop.    When using Ace Wraps start the wrapping distally (farthest away from the body) and wrap proximally (closer to the body)   Example: If you had surgery on your leg or thing and you do not have a splint on, start the ace wrap at the toes and work your way up to the thigh        If you had surgery on your upper extremity and do not have a splint on, start the ace wrap at your fingers and work your way up to the upper arm  IF YOU ARE IN A SPLINT OR CAST DO NOT REMOVE IT FOR ANY REASON   If your splint gets wet for any reason please contact the office immediately. You may shower in your splint or cast as long as you keep it dry.  This can be done by wrapping in a cast cover or garbage back (or similar)  Do Not stick any thing down your splint or cast such as pencils, money, or hangers to try and scratch yourself with.  If you feel itchy take benadryl as prescribed on the  bottle for itching  IF YOU ARE IN A CAM BOOT (BLACK BOOT)  You may remove boot periodically. Perform daily dressing changes as noted below.  Wash the liner of the boot regularly and wear a sock when wearing the boot. It is recommended that you sleep in the boot until told otherwise  CALL THE OFFICE WITH ANY QUESTIONS OR CONCERNS: [phone]    Post Anesthesia Home Care Instructions  Activity: Get plenty of rest for the remainder of the day. A responsible individual must stay with you for 24 hours following the procedure.  For the next 24 hours, DO NOT: -Drive a car -Advertising copywriter -Drink alcoholic beverages -Take any medication unless instructed by your physician -Make any legal decisions or sign important papers.  Meals: Start with liquid foods such as gelatin or soup. Progress to regular foods as tolerated. Avoid greasy, spicy, heavy foods. If nausea and/or  vomiting occur, drink only clear liquids until the nausea and/or vomiting subsides. Call your physician if vomiting continues.  Special Instructions/Symptoms: Your throat may feel dry or sore from the anesthesia or the breathing tube placed in your throat during surgery. If this causes discomfort, gargle with warm salt water. The discomfort should disappear within 24 hours.  If you had a scopolamine patch placed behind your ear for the management of post- operative nausea and/or vomiting:  1. The medication in the patch is effective for 72 hours, after which it should be removed.  Wrap patch in a tissue and discard in the trash. Wash hands thoroughly with soap and water. 2. You may remove the patch earlier than 72 hours if you experience unpleasant side effects which may include dry mouth, dizziness or visual disturbances. 3. Avoid touching the patch. Wash your hands with soap and water after contact with the patch.

## 2018-08-15 NOTE — Transfer of Care (Signed)
Immediate Anesthesia Transfer of Care Note  Patient: Lauren [name]  Procedure(s) Performed: BONE EXCISION TALUS (Right Ankle) HARDWARE REMOVAL RIGHT ANKLE (Right Ankle)  Patient Location: PACU  Anesthesia Type:General and Regional  Level of Consciousness: awake and patient cooperative  Airway & Oxygen Therapy: Patient Spontanous Breathing and Patient connected to face mask oxygen  Post-op Assessment: Report given to RN and Post -op Vital signs reviewed and stable  Post vital signs: Reviewed and stable  Last Vitals:  Vitals Value Taken Time  BP    Temp    Pulse 81 08/15/2018 10:06 AM  Resp 14 08/15/2018 10:06 AM  SpO2 100 % 08/15/2018 10:06 AM  Vitals shown include unvalidated device data.  Last Pain:  Vitals:   08/15/18 16100632  TempSrc:   PainSc: 0-No pain      Patients Stated Pain Goal: 2 (08/15/18 [id number])  Complications: No apparent anesthesia complications

## 2018-08-15 NOTE — Anesthesia Procedure Notes (Signed)
Procedure Name: Intubation Date/Time: 08/15/2018 8:25 AM Performed by: Julian ReilWelty, Jeremia Groot F, CRNA Pre-anesthesia Checklist: Patient identified, Emergency Drugs available, Suction available and Patient being monitored Patient Re-evaluated:Patient Re-evaluated prior to induction Oxygen Delivery Method: Circle system utilized Preoxygenation: Pre-oxygenation with 100% oxygen Induction Type: IV induction Ventilation: Mask ventilation without difficulty Laryngoscope Size: Miller and 3 Grade View: Grade I Tube type: Oral Tube size: 7.0 mm Number of attempts: 1 Airway Equipment and Method: Stylet Placement Confirmation: positive ETCO2,  ETT inserted through vocal cords under direct vision and breath sounds checked- equal and bilateral Secured at: 22 cm Tube secured with: Tape Dental Injury: Teeth and Oropharynx as per pre-operative assessment  Comments: 4x4s bite block used.
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
## 2018-08-16 ENCOUNTER — Encounter (HOSPITAL_COMMUNITY): Payer: Self-pay | Admitting: Orthopedic Surgery

## 2018-08-16 IMAGING — RF DG ANKLE COMPLETE 3+V*R*
1 series · 4 of 4 positions shown · non-contrast
Comparison: Right ankle films of 02/20/2017

CLINICAL DATA: Irrigation and debridement of the right ankle and
talus

EXAM:
RIGHT ANKLE - COMPLETE 3+ VIEW

[Series 1: run · 4 of 4 slices shown]
[im 1/4]
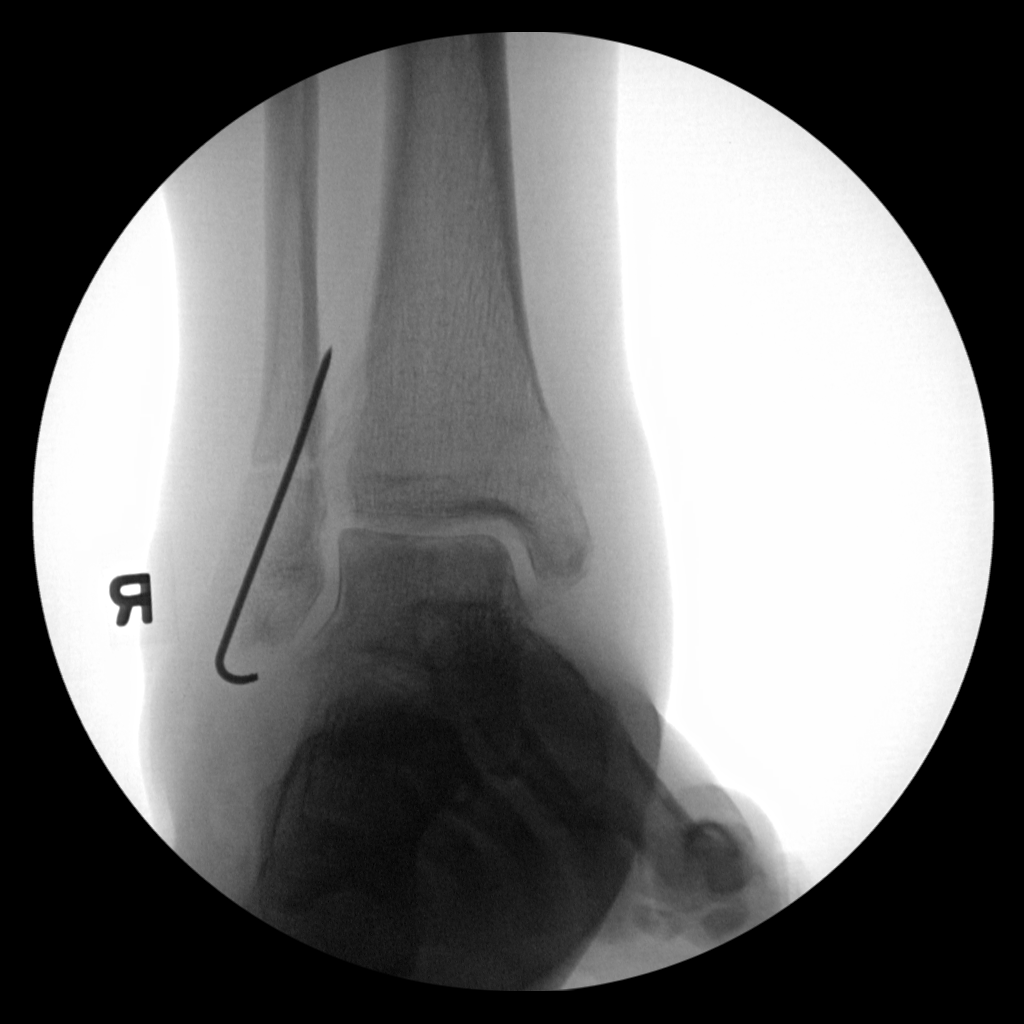
[im 2/4]
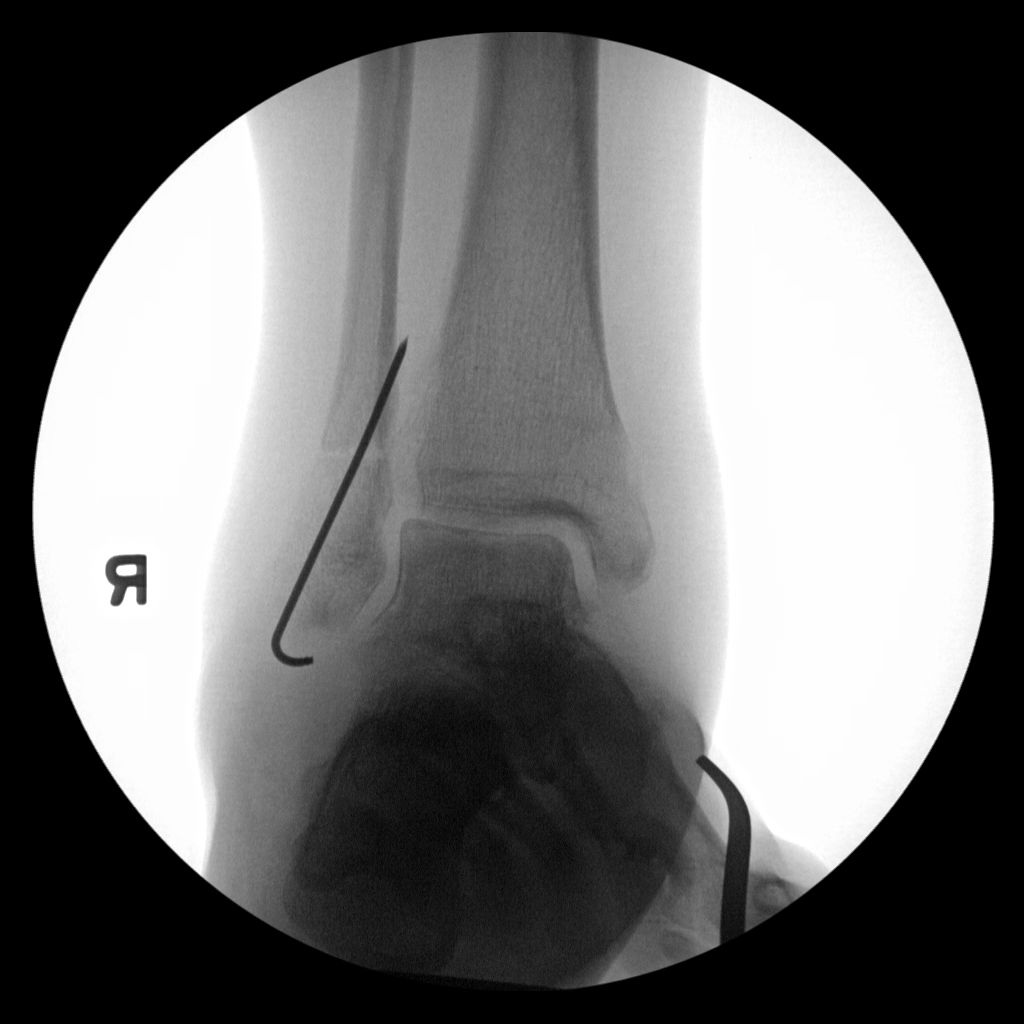
[im 3/4]
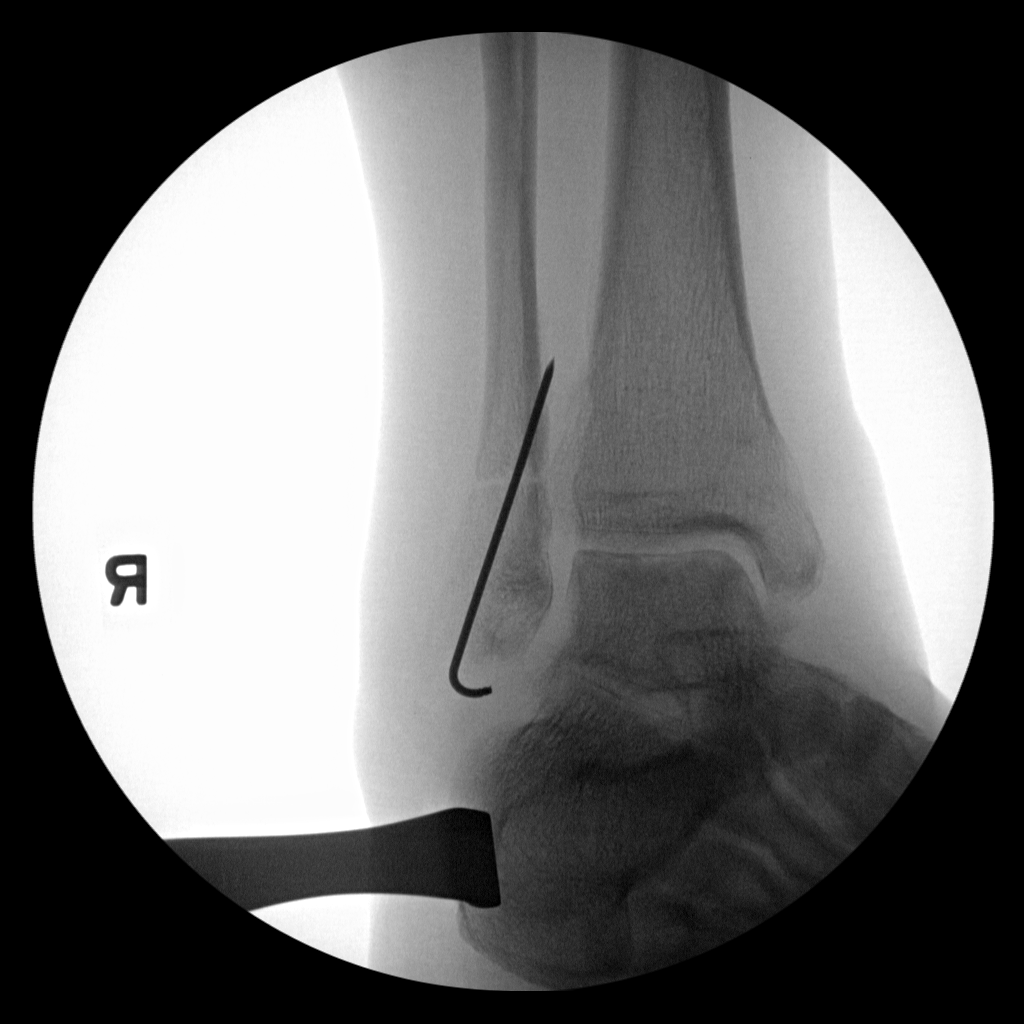
[im 4/4]
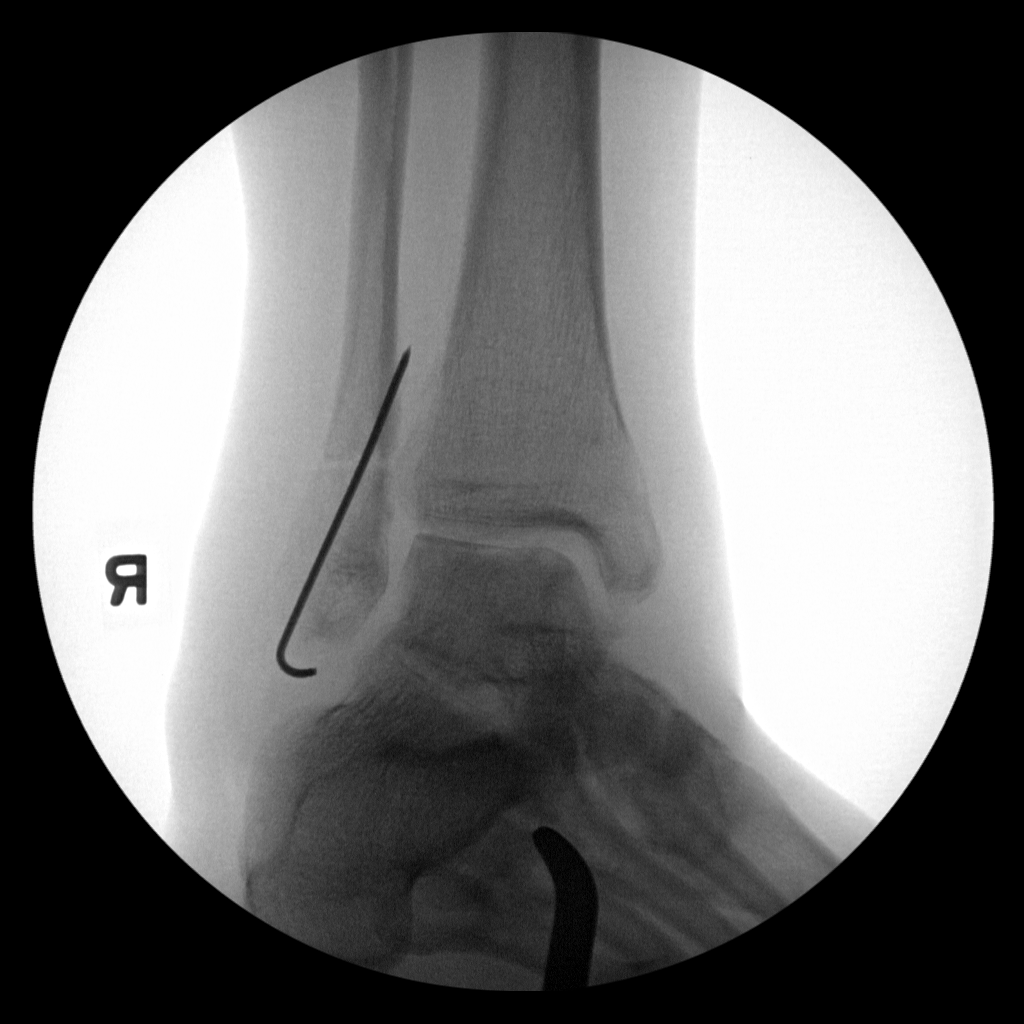

[4 of 4 positions shown; findings below may reference images not displayed]

FINDINGS: Four C-arm spot films were returned. A K-wire is present within the
distal right fibula for fixation of distal right fibular fracture.
Tibiotalar joint space is unremarkable..
IMPRESSION: Fixation of distal right fibular fracture. Tibiotalar alignment is
unremarkable.
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
## 2018-12-30 IMAGING — RF DG C-ARM 61-120 MIN
1 series · 5 of 5 positions shown · non-contrast
Comparison: CT 07/11/2018

CLINICAL DATA: S/P BONE EXCISION TALUS (Right Ankle) HARDWARE
REMOVAL RIGHT ANKLE RSTO: [facility] FLUORO TIME: 20 SECS

EXAM:
RIGHT ANKLE - COMPLETE 3+ VIEW; DG C-ARM 61-120 MIN

[Series 1: run · 5 of 5 slices shown]
[im 1/5]
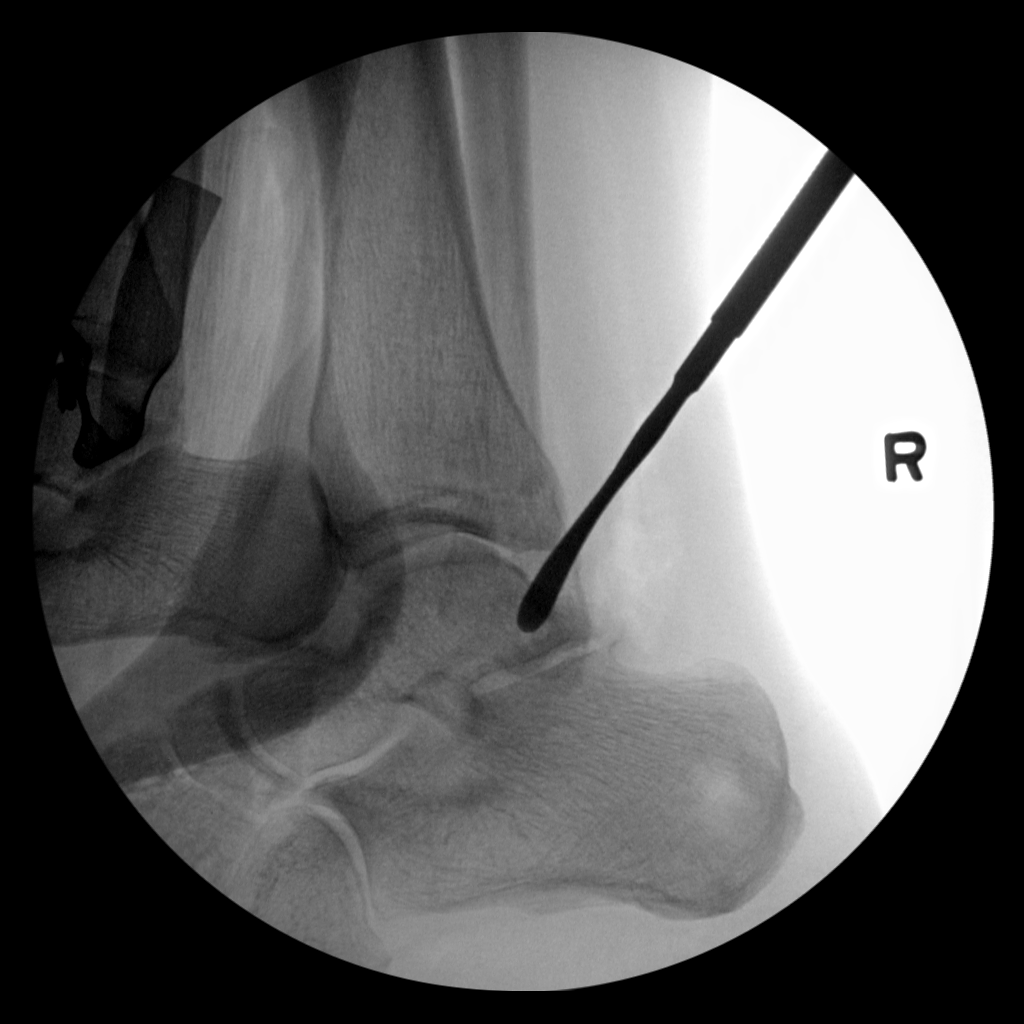
[im 2/5]
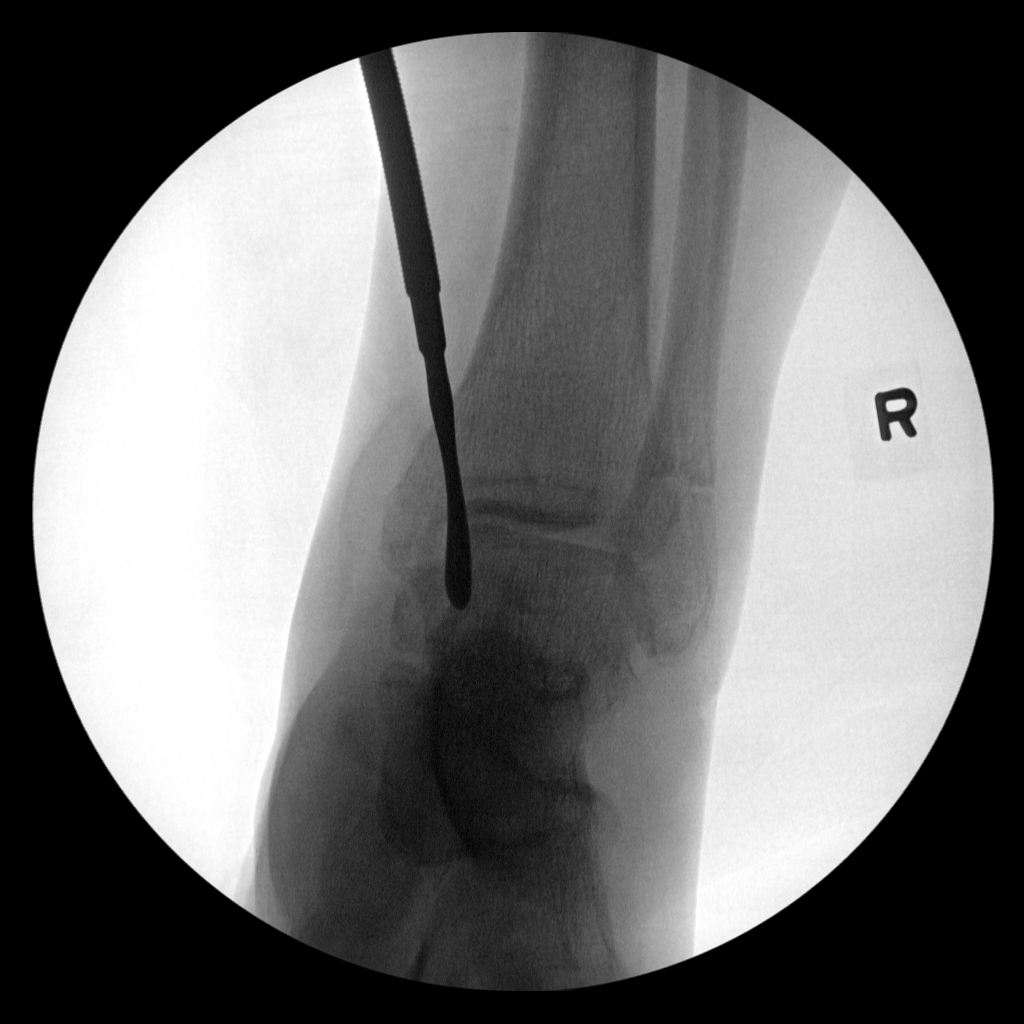
[im 3/5]
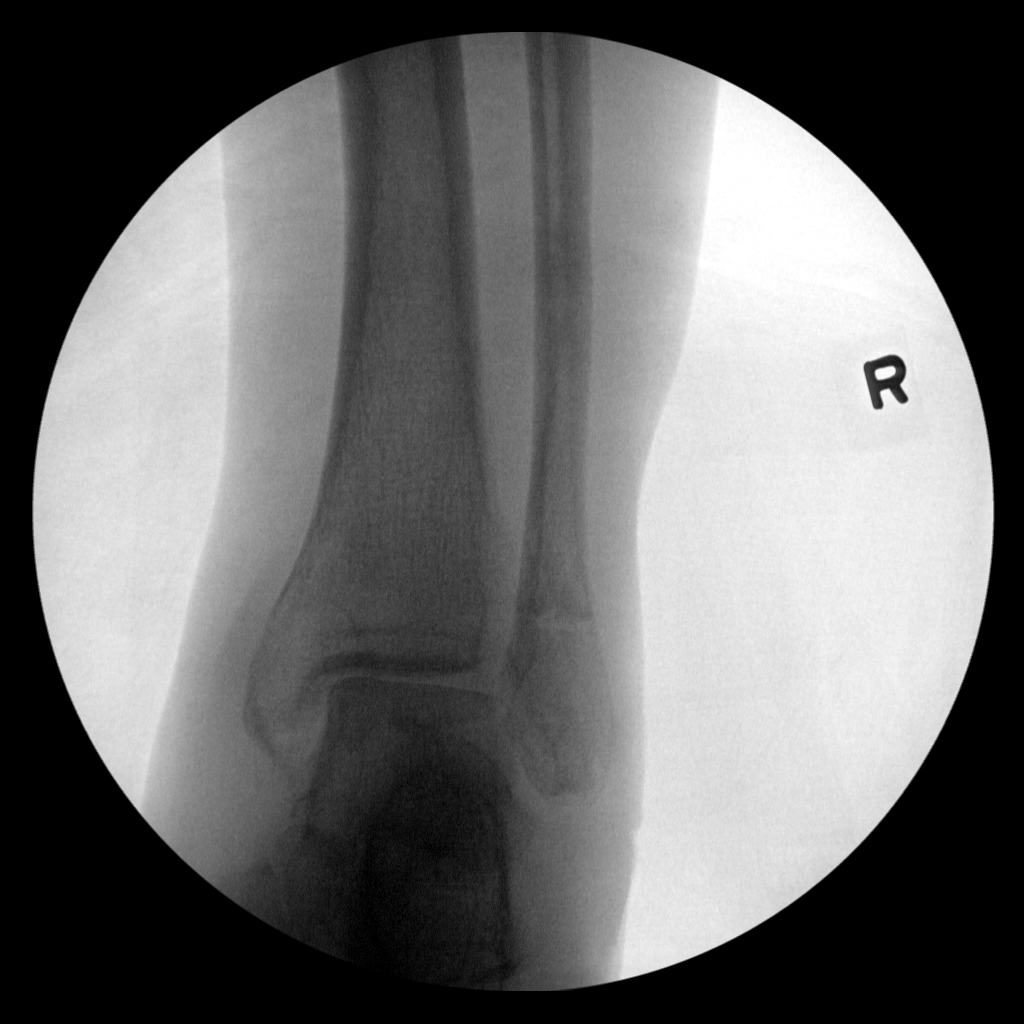
[im 4/5]
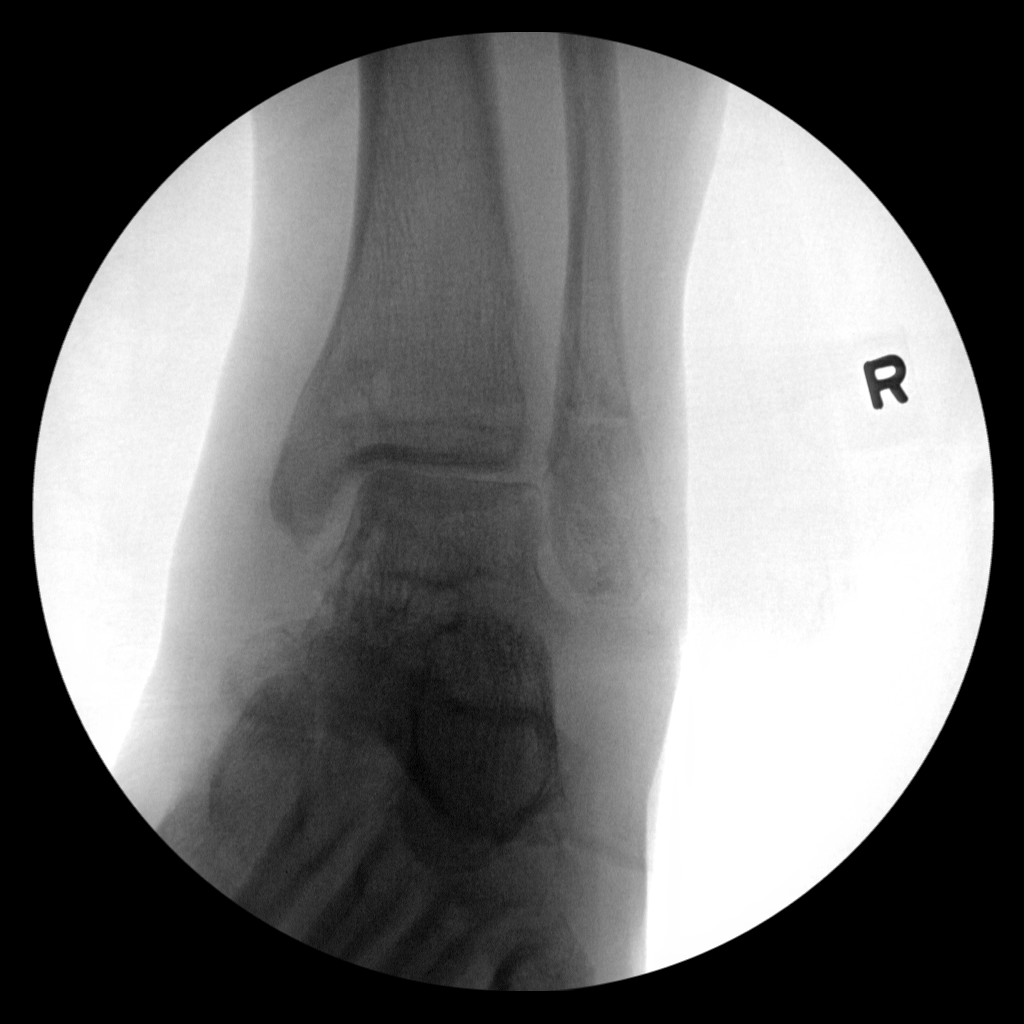
[im 5/5]
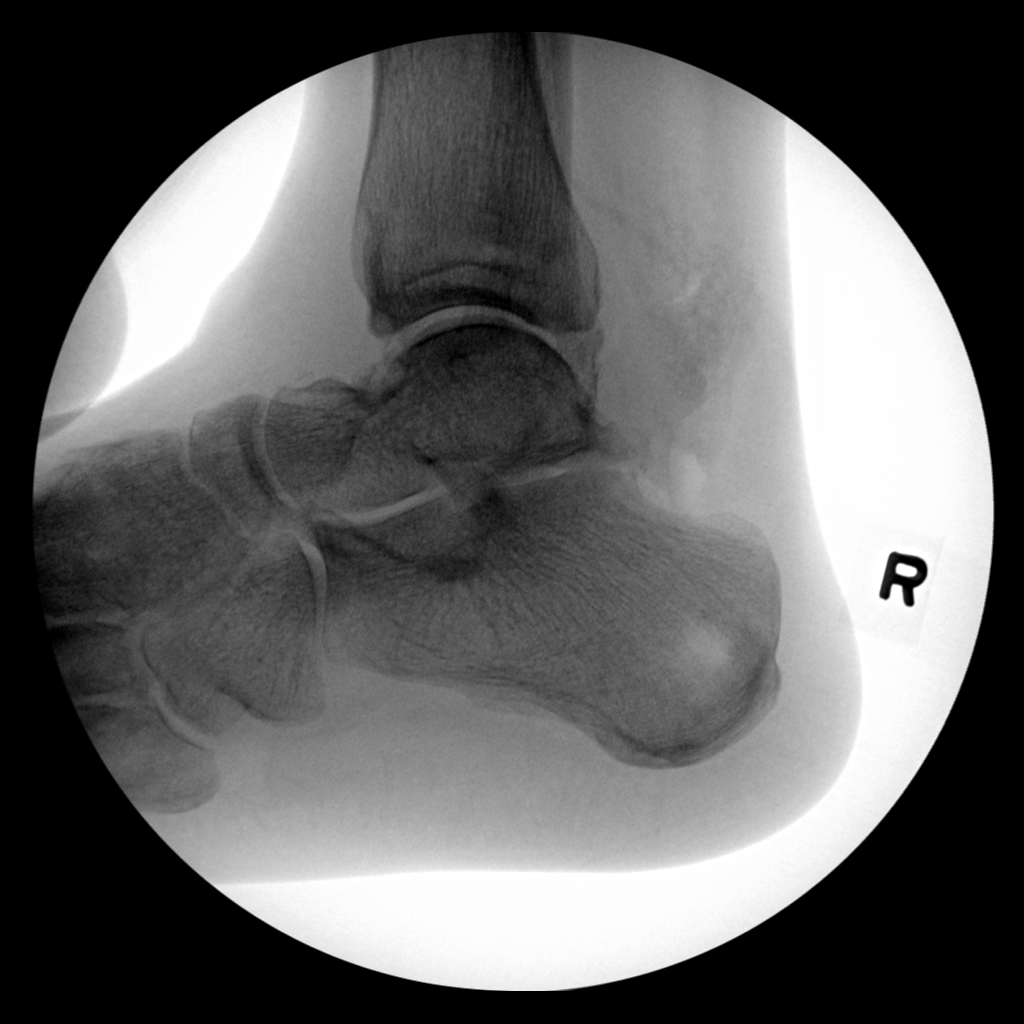

[5 of 5 positions shown; findings below may reference images not displayed]

FINDINGS: A series of intraoperative fluoroscopic images document surgical
resection of posteromedial talar fracture fragment.
IMPRESSION: Surgical removal of posteromedial talar fracture fragment.

## 2019-12-09 ENCOUNTER — Ambulatory Visit: Payer: BC Managed Care – PPO | Admitting: Sports Medicine
